# Patient Record
Sex: Male | Born: 1965
Health system: Southern US, Community
[De-identification: ages and names within clinical notes are randomized; demographics above are authoritative.]

## PROBLEM LIST (undated history)

## (undated) DIAGNOSIS — E785 Hyperlipidemia, unspecified: Secondary | ICD-10-CM

## (undated) DIAGNOSIS — M722 Plantar fascial fibromatosis: Secondary | ICD-10-CM

## (undated) DIAGNOSIS — R51 Headache: Secondary | ICD-10-CM

## (undated) DIAGNOSIS — R7302 Impaired glucose tolerance (oral): Secondary | ICD-10-CM

## (undated) DIAGNOSIS — M549 Dorsalgia, unspecified: Secondary | ICD-10-CM

## (undated) DIAGNOSIS — K219 Gastro-esophageal reflux disease without esophagitis: Secondary | ICD-10-CM

## (undated) DIAGNOSIS — I10 Essential (primary) hypertension: Secondary | ICD-10-CM

## (undated) DIAGNOSIS — A048 Other specified bacterial intestinal infections: Secondary | ICD-10-CM

## (undated) DIAGNOSIS — J309 Allergic rhinitis, unspecified: Secondary | ICD-10-CM

## (undated) HISTORY — DX: Gastro-esophageal reflux disease without esophagitis: K21.9

## (undated) HISTORY — DX: Hyperlipidemia, unspecified: E78.5

## (undated) HISTORY — DX: Other specified bacterial intestinal infections: A04.8

## (undated) HISTORY — PX: WISDOM TOOTH EXTRACTION: SHX21

## (undated) HISTORY — DX: Headache: R51

## (undated) HISTORY — DX: Essential (primary) hypertension: I10

## (undated) HISTORY — DX: Impaired glucose tolerance (oral): R73.02

## (undated) HISTORY — DX: Allergic rhinitis, unspecified: J30.9

## (undated) HISTORY — DX: Plantar fascial fibromatosis: M72.2

## (undated) HISTORY — DX: Dorsalgia, unspecified: M54.9

---
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
## 2000-08-06 ENCOUNTER — Ambulatory Visit (HOSPITAL_COMMUNITY): Admission: RE | Admit: 2000-08-06 | Discharge: 2000-08-06 | Payer: Self-pay | Admitting: Family Medicine

## 2000-08-06 ENCOUNTER — Encounter: Payer: Self-pay | Admitting: Family Medicine

## 2000-08-13 ENCOUNTER — Encounter: Payer: Self-pay | Admitting: Family Medicine

## 2000-08-13 ENCOUNTER — Ambulatory Visit (HOSPITAL_COMMUNITY): Admission: RE | Admit: 2000-08-13 | Discharge: 2000-08-13 | Payer: Self-pay | Admitting: Family Medicine

## 2004-10-05 ENCOUNTER — Emergency Department (HOSPITAL_COMMUNITY): Admission: EM | Admit: 2004-10-05 | Discharge: 2004-10-06 | Payer: Self-pay | Admitting: Emergency Medicine

## 2009-07-19 ENCOUNTER — Ambulatory Visit: Payer: Self-pay | Admitting: Internal Medicine

## 2009-07-19 DIAGNOSIS — M549 Dorsalgia, unspecified: Secondary | ICD-10-CM

## 2009-07-19 DIAGNOSIS — M722 Plantar fascial fibromatosis: Secondary | ICD-10-CM | POA: Insufficient documentation

## 2009-07-19 DIAGNOSIS — J309 Allergic rhinitis, unspecified: Secondary | ICD-10-CM | POA: Insufficient documentation

## 2009-07-19 HISTORY — DX: Allergic rhinitis, unspecified: J30.9

## 2009-07-19 HISTORY — DX: Plantar fascial fibromatosis: M72.2

## 2009-07-19 HISTORY — DX: Dorsalgia, unspecified: M54.9

## 2009-07-19 LAB — CONVERTED CEMR LAB
ALT: 19 units/L (ref 0–53)
AST: 18 units/L (ref 0–37)
Albumin: 4.1 g/dL (ref 3.5–5.2)
Alkaline Phosphatase: 80 units/L (ref 39–117)
BUN: 12 mg/dL (ref 6–23)
Basophils Absolute: 0.1 10*3/uL (ref 0.0–0.1)
Basophils Relative: 0.8 % (ref 0.0–3.0)
Bilirubin Urine: NEGATIVE
Bilirubin, Direct: 0.1 mg/dL (ref 0.0–0.3)
CO2: 35 meq/L — ABNORMAL HIGH (ref 19–32)
Calcium: 8.8 mg/dL (ref 8.4–10.5)
Chloride: 104 meq/L (ref 96–112)
Cholesterol: 207 mg/dL — ABNORMAL HIGH (ref 0–200)
Creatinine, Ser: 0.8 mg/dL (ref 0.4–1.5)
Direct LDL: 149.7 mg/dL
Eosinophils Absolute: 0.5 10*3/uL (ref 0.0–0.7)
Eosinophils Relative: 6.7 % — ABNORMAL HIGH (ref 0.0–5.0)
Glucose, Bld: 106 mg/dL — ABNORMAL HIGH (ref 70–99)
HCT: 45 % (ref 39.0–52.0)
HDL: 36.5 mg/dL — ABNORMAL LOW (ref >39.00)
Hemoglobin, Urine: NEGATIVE
Hemoglobin: 15.7 g/dL (ref 13.0–17.0)
Ketones, ur: NEGATIVE mg/dL
Leukocytes, UA: NEGATIVE
Lymphocytes Relative: 36.6 % (ref 12.0–46.0)
Lymphs Abs: 3 10*3/uL (ref 0.7–4.0)
MCHC: 34.9 g/dL (ref 30.0–36.0)
MCV: 89.8 fL (ref 78.0–100.0)
Monocytes Absolute: 0.6 10*3/uL (ref 0.1–1.0)
Monocytes Relative: 7.3 % (ref 3.0–12.0)
Neutro Abs: 3.9 10*3/uL (ref 1.4–7.7)
Neutrophils Relative %: 48.6 % (ref 43.0–77.0)
Nitrite: NEGATIVE
PSA: 0.43 ng/mL (ref 0.10–4.00)
Platelets: 284 10*3/uL (ref 150.0–400.0)
Potassium: 4.6 meq/L (ref 3.5–5.1)
RBC: 5.01 M/uL (ref 4.22–5.81)
RDW: 12.8 % (ref 11.5–14.6)
Sodium: 143 meq/L (ref 135–145)
Specific Gravity, Urine: 1.025 (ref 1.000–1.030)
TSH: 2.16 microintl units/mL (ref 0.35–5.50)
Total Bilirubin: 0.8 mg/dL (ref 0.3–1.2)
Total CHOL/HDL Ratio: 6
Total Protein, Urine: NEGATIVE mg/dL
Total Protein: 7.4 g/dL (ref 6.0–8.3)
Triglycerides: 114 mg/dL (ref 0.0–149.0)
Urine Glucose: NEGATIVE mg/dL
Urobilinogen, UA: 0.2 (ref 0.0–1.0)
VLDL: 22.8 mg/dL (ref 0.0–40.0)
WBC: 8.1 10*3/uL (ref 4.5–10.5)
pH: 6 (ref 5.0–8.0)

## 2010-08-06 ENCOUNTER — Ambulatory Visit: Payer: Self-pay | Admitting: Internal Medicine

## 2010-08-06 ENCOUNTER — Encounter: Payer: Self-pay | Admitting: Internal Medicine

## 2010-08-06 DIAGNOSIS — K219 Gastro-esophageal reflux disease without esophagitis: Secondary | ICD-10-CM

## 2010-08-06 DIAGNOSIS — R519 Headache, unspecified: Secondary | ICD-10-CM | POA: Insufficient documentation

## 2010-08-06 DIAGNOSIS — R51 Headache: Secondary | ICD-10-CM

## 2010-08-06 DIAGNOSIS — E785 Hyperlipidemia, unspecified: Secondary | ICD-10-CM

## 2010-08-06 HISTORY — DX: Gastro-esophageal reflux disease without esophagitis: K21.9

## 2010-08-06 HISTORY — DX: Headache: R51

## 2010-08-06 HISTORY — DX: Hyperlipidemia, unspecified: E78.5

## 2010-08-10 ENCOUNTER — Encounter: Payer: Self-pay | Admitting: Internal Medicine

## 2011-01-15 NOTE — Miscellaneous (Signed)
Summary: Orders Update  Clinical Lists Changes  Orders: Added new Service order of EKG w/ Interpretation (93000) - Signed 

## 2011-01-15 NOTE — Assessment & Plan Note (Signed)
Summary: physical per son--stc   Vital Signs:  Patient profile:   45 year old male Height:      63 inches Weight:      128.50 pounds BMI:     22.85 O2 Sat:      95 % on Room air Temp:     98.5 degrees F oral Pulse rate:   76 / minute BP sitting:   120 / 84  (left arm) Cuff size:   regular  Vitals Entered By: Zella Ball Ewing CMA (AAMA) (August 06, 2010 1:11 PM)  O2 Flow:  Room air  CC: Adult Physical/RE   CC:  Adult Physical/RE.  History of Present Illness: overall doing well,  Pt denies CP, worsening sob, doe, wheezing, orthopnea, pnd, worsening LE edema, palps, dizziness or syncope Pt denies new neuro symptoms such as headache, facial or extremity weakness  No fever, wt loss, night sweats, loss of appetite or other constitutional symptoms  Overall good compliance with meds, good tolerability.  Inicdently today with acute onset mild to mod ST with headache, low grade temp, and general weakness.    Preventive Screening-Counseling & Management      Drug Use:  no.    Problems Prior to Update: 1)  Gerd  (ICD-530.81) 2)  Headache  (ICD-784.0) 3)  Pharyngitis-acute  (ICD-462) 4)  Hyperlipidemia  (ICD-272.4) 5)  Plantar Fasciitis  (ICD-728.71) 6)  Back Pain  (ICD-724.5) 7)  Preventive Health Care  (ICD-V70.0) 8)  Allergic Rhinitis  (ICD-477.9)  Medications Prior to Update: 1)  Nasacort Aq 55 Mcg/act Aers (Triamcinolone Acetonide(Nasal)) .... 2 Spray/side Once Daily  Current Medications (verified): 1)  Azithromycin 250 Mg Tabs (Azithromycin) .... 2po Qd For 1 Day, Then 1po Qd For 4days, Then Stop 2)  Omeprazole 20 Mg Cpdr (Omeprazole) .Marland Kitchen.. 1po Once Daily  Allergies (verified): No Known Drug Allergies  Past History:  Past Surgical History: Last updated: 07/19/2009 Denies surgical history  Family History: Last updated: 07/19/2009 mother with HTN, stroke  Social History: Last updated: 08/06/2010 Married 2 children work -  Advertising copywriter - Clinical cytogeneticist Never Smoked Alcohol use-yes - 3 -4 beers /wk came to [place] from [place] in 1994 Drug use-no  Risk Factors: Smoking Status: never (07/19/2009)  Past Medical History: Allergic rhinitis hx of prostatitis Hyperlipidemia GERD  Social History: Reviewed history from 07/19/2009 and no changes required. Married 2 children work -  Advertising copywriter - Location manager Never Smoked Alcohol use-yes - 3 -4 beers /wk came to [place] from [place] in 1994 Drug use-no Drug Use:  no  Review of Systems  The patient denies anorexia, fever, weight loss, weight gain, vision loss, decreased hearing, hoarseness, chest pain, syncope, dyspnea on exertion, peripheral edema, prolonged cough, headaches, hemoptysis, abdominal pain, melena, hematochezia, severe indigestion/heartburn, hematuria, muscle weakness, suspicious skin lesions, transient blindness, difficulty walking, depression, unusual weight change, abnormal bleeding, enlarged lymph nodes, and angioedema.         all otherwise negative per pt -  except for mild reflux symtpoms without dysphagia, wt loss, n/v or abd pain  Physical Exam  General:  alert and well-developed.  , mild ill  Head:  normocephalic and atraumatic.   Eyes:  vision grossly intact, pupils equal, and pupils round.   Ears:  bilat tm's mild red, sinus nontender Nose:  nasal dischargemucosal pallor and mucosal edema.   Mouth:  pharyngeal erythema and fair dentition.   Neck:  supple and no masses.   Lungs:  normal respiratory effort and normal breath  sounds.   Heart:  normal rate and regular rhythm.   Abdomen:  soft, non-tender, and normal bowel sounds.   Msk:  no joint tenderness and no joint swelling.   Extremities:  no edema, no erythema  Neurologic:  cranial nerves II-XII intact and strength normal in all extremities.     Impression & Recommendations:  Problem # 1:  Preventive Health Care (ICD-V70.0) Overall doing well, age appropriate education and counseling  updated and referral for appropriate preventive services done unless declined, immunizations up to date or declined, diet counseling done if overweight, urged to quit smoking if smokes , most recent labs reviewed and current ordered if appropriate, ecg reviewed or declined (interpretation per ECG scanned in the EMR if done); information regarding Medicare Prevention requirements given if appropriate; speciality referrals updated as appropriate   Problem # 2:  HYPERLIPIDEMIA (ICD-272.4)  to follow lower chol diet, delcines statin   Labs Reviewed: SGOT: 18 (07/19/2009)   SGPT: 19 (07/19/2009)   HDL:36.50 (07/19/2009)  Chol:207 (07/19/2009)  Trig:114.0 (07/19/2009)  Problem # 3:  PHARYNGITIS-ACUTE (ICD-462)  His updated medication list for this problem includes:    Azithromycin 250 Mg Tabs (Azithromycin) .Marland Kitchen... 2po qd for 1 day, then 1po qd for 4days, then stop treat as above, f/u any worsening signs or symptoms   Problem # 4:  HEADACHE (ICD-784.0) likely assoc with above - ok for tylenol as needed   Problem # 5:  GERD (ICD-530.81)  His updated medication list for this problem includes:    Omeprazole 20 Mg Cpdr (Omeprazole) .Marland Kitchen... 1po once daily gave sample and rx for above; f/u as needed   Complete Medication List: 1)  Azithromycin 250 Mg Tabs (Azithromycin) .... 2po qd for 1 day, then 1po qd for 4days, then stop 2)  Omeprazole 20 Mg Cpdr (Omeprazole) .Marland Kitchen.. 1po once daily  Patient Instructions: 1)  Please take all new medications as prescribed  - the antibiotic, and the prilosec at 1 per day  (the prescription for the omeprazole is the same as the Prilosec - to take after the samples are done) 2)  OK to also use tylenol as you have been for the headache 3)  Please follow a lower cholesterol diet, and try to be more active if possible with regular excercise 4)  Please schedule a follow-up appointment in 1 year, or sooner if needed Prescriptions: OMEPRAZOLE 20 MG CPDR (OMEPRAZOLE) 1po  once daily  #90 x 3   Entered and Authorized by:   Corwin Levins MD   Signed by:   Corwin Levins MD on 08/06/2010   Method used:   Print then Give to Patient   RxID:   [id number] AZITHROMYCIN 250 MG TABS (AZITHROMYCIN) 2po qd for 1 day, then 1po qd for 4days, then stop  #6 x 1   Entered and Authorized by:   Corwin Levins MD   Signed by:   Corwin Levins MD on 08/06/2010   Method used:   Print then Give to Patient   RxID:   [id number]

## 2011-08-18 ENCOUNTER — Telehealth: Payer: Self-pay

## 2011-08-18 DIAGNOSIS — Z0000 Encounter for general adult medical examination without abnormal findings: Secondary | ICD-10-CM

## 2011-08-18 DIAGNOSIS — Z1289 Encounter for screening for malignant neoplasm of other sites: Secondary | ICD-10-CM

## 2011-08-18 NOTE — Telephone Encounter (Signed)
Put order in for labs. 

## 2011-09-20 ENCOUNTER — Encounter: Payer: Self-pay | Admitting: Internal Medicine

## 2011-09-20 DIAGNOSIS — Z0000 Encounter for general adult medical examination without abnormal findings: Secondary | ICD-10-CM | POA: Insufficient documentation

## 2011-09-20 DIAGNOSIS — Z0001 Encounter for general adult medical examination with abnormal findings: Secondary | ICD-10-CM | POA: Insufficient documentation

## 2011-09-25 ENCOUNTER — Other Ambulatory Visit (INDEPENDENT_AMBULATORY_CARE_PROVIDER_SITE_OTHER): Payer: BC Managed Care – PPO

## 2011-09-25 ENCOUNTER — Encounter: Payer: Self-pay | Admitting: Internal Medicine

## 2011-09-25 ENCOUNTER — Ambulatory Visit (INDEPENDENT_AMBULATORY_CARE_PROVIDER_SITE_OTHER): Payer: BC Managed Care – PPO | Admitting: Internal Medicine

## 2011-09-25 VITALS — BP 100/54 | HR 90 | Temp 97.6°F | Ht 62.0 in | Wt 131.2 lb

## 2011-09-25 DIAGNOSIS — K219 Gastro-esophageal reflux disease without esophagitis: Secondary | ICD-10-CM

## 2011-09-25 DIAGNOSIS — Z0000 Encounter for general adult medical examination without abnormal findings: Secondary | ICD-10-CM

## 2011-09-25 LAB — CBC WITH DIFFERENTIAL/PLATELET
Basophils Absolute: 0 10*3/uL (ref 0.0–0.1)
Basophils Relative: 0.2 % (ref 0.0–3.0)
Eosinophils Relative: 5 % (ref 0.0–5.0)
HCT: 44.5 % (ref 39.0–52.0)
Hemoglobin: 14.7 g/dL (ref 13.0–17.0)
Lymphocytes Relative: 32 % (ref 12.0–46.0)
Lymphs Abs: 2.9 10*3/uL (ref 0.7–4.0)
Monocytes Relative: 8 % (ref 3.0–12.0)
Neutro Abs: 5 10*3/uL (ref 1.4–7.7)
RBC: 4.77 Mil/uL (ref 4.22–5.81)
RDW: 13.3 % (ref 11.5–14.6)
WBC: 9.1 10*3/uL (ref 4.5–10.5)

## 2011-09-25 LAB — LIPID PANEL
HDL: 44.6 mg/dL (ref >39.00)
Triglycerides: 164 mg/dL — ABNORMAL HIGH (ref 0.0–149.0)
VLDL: 32.8 mg/dL (ref 0.0–40.0)

## 2011-09-25 LAB — BASIC METABOLIC PANEL
BUN: 14 mg/dL (ref 6–23)
CO2: 34 mEq/L — ABNORMAL HIGH (ref 19–32)
Calcium: 8.8 mg/dL (ref 8.4–10.5)
Creatinine, Ser: 0.8 mg/dL (ref 0.4–1.5)
GFR: 111.96 mL/min (ref >60.00)
Glucose, Bld: 128 mg/dL — ABNORMAL HIGH (ref 70–99)

## 2011-09-25 LAB — URINALYSIS, ROUTINE W REFLEX MICROSCOPIC
Ketones, ur: NEGATIVE
Leukocytes, UA: NEGATIVE
Nitrite: NEGATIVE
Specific Gravity, Urine: 1.02 (ref 1.000–1.030)
Urobilinogen, UA: 0.2 (ref 0.0–1.0)
pH: 5.5 (ref 5.0–8.0)

## 2011-09-25 LAB — HEPATIC FUNCTION PANEL
Albumin: 4.1 g/dL (ref 3.5–5.2)
Total Protein: 7.1 g/dL (ref 6.0–8.3)

## 2011-09-25 LAB — PSA: PSA: 0.54 ng/mL (ref 0.10–4.00)

## 2011-09-25 MED ORDER — PANTOPRAZOLE SODIUM 40 MG PO TBEC: 40.0000 mg | DELAYED_RELEASE_TABLET | Freq: Every day | ORAL | Status: DC

## 2011-09-25 NOTE — Patient Instructions (Addendum)
Please stop the omeprazole 20 mg Start the generic Protonix 40 mg per day (for the reflux) Please go to LAB in the Basement for the blood and/or urine tests to be done today Please call the phone number [phone] (the [employer] System) for results of testing in 2-3 days;  When calling, simply dial the number, and when prompted enter the MRN number above (the Medical Record Number) and the # key, then the message should start. Please call for referral to ENT if the chronic tonsillitis is worse Please return in 1 year for your yearly visit, or sooner if needed, with Lab testing done 3-5 days before

## 2011-09-25 NOTE — Assessment & Plan Note (Signed)

## 2011-09-26 ENCOUNTER — Encounter: Payer: Self-pay | Admitting: Internal Medicine

## 2011-09-26 DIAGNOSIS — R7302 Impaired glucose tolerance (oral): Secondary | ICD-10-CM

## 2011-09-26 HISTORY — DX: Impaired glucose tolerance (oral): R73.02

## 2011-09-26 NOTE — Progress Notes (Signed)
Subjective:    Patient ID: Roger Long, male    DOB: 27-Jul-1966, 45 y.o.   MRN: [id number]  HPI  Here for wellness and f/u;  Overall doing ok;  Pt denies CP, worsening SOB, DOE, wheezing, orthopnea, PND, worsening LE edema, palpitations, dizziness or syncope.  Pt denies neurological change such as new Headache, facial or extremity weakness.  Pt denies polydipsia, polyuria, or low sugar symptoms. Pt states overall good compliance with treatment and medications, good tolerability, and trying to follow lower cholesterol diet.  Pt denies worsening depressive symptoms, suicidal ideation or panic. No fever, wt loss, night sweats, loss of appetite, or other constitutional symptoms.  Pt states good ability with ADL's, low fall risk, home safety reviewed and adequate, no significant changes in hearing or vision, and occasionally active with exercise.  States does have symptoms of chronic tonsillitis with right side post pharynx tonsil with recurring material coming out that ends up on the tongue intermittent mild for almost a yr.  Also has a minor recurring tenderness to the right lateral epicondylar area without swelling, erythema, trauma or decreased ROM elbow.  Does also have mild intermittent reflux, but no dysphagia, abd pain, n/v, bowel change or blood. Past Medical History  Diagnosis Date  . ALLERGIC RHINITIS 07/19/2009  . BACK PAIN 07/19/2009  . GERD 08/06/2010  . Headache 08/06/2010  . HYPERLIPIDEMIA 08/06/2010  . PLANTAR FASCIITIS 07/19/2009  . Impaired glucose tolerance 09/26/2011   No past surgical history on file.  reports that he has never smoked. He does not have any smokeless tobacco history on file. He reports that he drinks alcohol. He reports that he does not use illicit drugs. family history includes Hypertension in his mother and Stroke in his mother. No Known Allergies No current outpatient prescriptions on file prior to visit.   Review of Systems Review of Systems  Constitutional: Negative  for diaphoresis, activity change, appetite change and unexpected weight change.  HENT: Negative for hearing loss, ear pain, facial swelling, mouth sores and neck stiffness.   Eyes: Negative for pain, redness and visual disturbance.  Respiratory: Negative for shortness of breath and wheezing.   Cardiovascular: Negative for chest pain and palpitations.  Gastrointestinal: Negative for diarrhea, blood in stool, abdominal distention and rectal pain.  Genitourinary: Negative for hematuria, flank pain and decreased urine volume.  Musculoskeletal: Negative for myalgias and joint swelling.  Skin: Negative for color change and wound.  Neurological: Negative for syncope and numbness.  Hematological: Negative for adenopathy.  Psychiatric/Behavioral: Negative for hallucinations, self-injury, decreased concentration and agitation.        Objective:   Physical Exam BP 100/54  Pulse 90  Temp(Src) 97.6 F (36.4 C) (Oral)  Ht 5\' 2"  (1.575 m)  Wt 131 lb 4 oz (59.535 kg)  BMI 24.01 kg/m2  SpO2 96% Physical Exam  VS noted Constitutional: Pt is oriented to person, place, and time. Appears well-developed and well-nourished.  HENT:  Head: Normocephalic and atraumatic.  Right Ear: External ear normal.  Left Ear: External ear normal.  Nose: Nose normal.  Mouth/Throat: Oropharynx is clear and moist. without overt tonsillar enlargment or mass visible Eyes: Conjunctivae and EOM are normal. Pupils are equal, round, and reactive to light.  Neck: Normal range of motion. Neck supple. No JVD present. No tracheal deviation present.  Cardiovascular: Normal rate, regular rhythm, normal heart sounds and intact distal pulses.   Pulmonary/Chest: Effort normal and breath sounds normal.  Abdominal: Soft. Bowel sounds are normal. There is no tenderness.  Musculoskeletal: Normal range of motion. Exhibits no edema.  Lymphadenopathy:  Has no cervical adenopathy.  Neurological: Pt is alert and oriented to person, place, and  time. Pt has normal reflexes. No cranial nerve deficit.  Skin: Skin is warm and dry. No rash noted.  Psychiatric:  Has  normal mood and affect. Behavior is normal.  Right elbow nontender, swollen or decreased ROM    Assessment & Plan:

## 2011-09-26 NOTE — Assessment & Plan Note (Signed)
Ok to change the PPI to protonix daily,  to f/u any worsening symptoms or concerns

## 2011-11-06 ENCOUNTER — Other Ambulatory Visit: Payer: Self-pay | Admitting: Emergency Medicine

## 2011-11-06 DIAGNOSIS — M542 Cervicalgia: Secondary | ICD-10-CM

## 2011-11-09 ENCOUNTER — Ambulatory Visit
Admission: RE | Admit: 2011-11-09 | Discharge: 2011-11-09 | Disposition: A | Discharge status: Home / Self Care | Payer: BC Managed Care – PPO | Source: Ambulatory Visit | Attending: Emergency Medicine | Admitting: Emergency Medicine

## 2011-11-09 DIAGNOSIS — M542 Cervicalgia: Secondary | ICD-10-CM

## 2011-11-09 MED ORDER — IOHEXOL 300 MG/ML  SOLN
75.0000 mL | Freq: Once | INTRAMUSCULAR | Status: AC | PRN
  Administered 2011-11-09: 75 mL via INTRAVENOUS

## 2012-02-19 ENCOUNTER — Ambulatory Visit (INDEPENDENT_AMBULATORY_CARE_PROVIDER_SITE_OTHER): Payer: BC Managed Care – PPO | Admitting: Family Medicine

## 2012-02-19 VITALS — BP 134/72 | HR 71 | Temp 98.2°F | Resp 16 | Ht 62.5 in | Wt 130.6 lb

## 2012-02-19 DIAGNOSIS — E785 Hyperlipidemia, unspecified: Secondary | ICD-10-CM

## 2012-02-19 DIAGNOSIS — I10 Essential (primary) hypertension: Secondary | ICD-10-CM

## 2012-02-19 MED ORDER — LISINOPRIL 20 MG PO TABS: 10.0000 mg | ORAL_TABLET | Freq: Every day | ORAL | Status: DC

## 2012-02-19 MED ORDER — PRAVASTATIN SODIUM 10 MG PO TABS: 10.0000 mg | ORAL_TABLET | Freq: Every day | ORAL | Status: DC

## 2012-02-19 NOTE — Progress Notes (Signed)
[facility]:  Office Visit  Chief Complaint:  Chief Complaint  Patient presents with  . Rx refill    pt is not fasting    HPI: Roger Long is a 46 y.o. male who complains of : 1. HTN med refill-not taking meds x 1 month, no sxs, no SEs when taking meds, does not measure BP regular  at home, when he does measure it is in the 130s/80s. Every time he has come to our office he has been in some type of pain/illness so BP was elevated.  2. Hyperlipidemia-did not know he had cholesterol problems, eats a lot of beef.   Past Medical History  Diagnosis Date  . ALLERGIC RHINITIS 07/19/2009  . BACK PAIN 07/19/2009  . GERD 08/06/2010  . Headache 08/06/2010  . HYPERLIPIDEMIA 08/06/2010  . PLANTAR FASCIITIS 07/19/2009  . Impaired glucose tolerance 09/26/2011   No past surgical history on file. History   Social History  . Marital Status: Married    Spouse Name: N/A    Number of Children: N/A  . Years of Education: N/A   Occupational History  . Wellsite geologist    Social History Main Topics  . Smoking status: Never Smoker   . Smokeless tobacco: None  . Alcohol Use: Yes     3-4 beers  week  . Drug Use: No  . Sexually Active: None   Other Topics Concern  . None   Social History Narrative   Came to [place] from [place] in 1994   Family History  Problem Relation Age of Onset  . Hypertension Mother   . Stroke Mother    No Known Allergies Prior to Admission medications   Medication Sig Start Date End Date Taking? Authorizing Provider  lisinopril (PRINIVIL,ZESTRIL) 20 MG tablet Take 20 mg by mouth daily.   Yes Historical Provider, MD  pantoprazole (PROTONIX) 40 MG tablet Take 1 tablet (40 mg total) by mouth daily. 09/25/11 09/24/12  Corwin Levins, MD     ROS: The patient denies fevers, chills, night sweats, unintentional weight loss, chest pain, palpitations, wheezing, dyspnea on exertion, nausea, vomiting, abdominal pain, dysuria, hematuria, melena,  numbness, weakness, or tingling.  All other systems have been reviewed and were otherwise negative with the exception of those mentioned in the HPI and as above.    PHYSICAL EXAM: Filed Vitals:   02/19/12 1342  BP: 134/72  Pulse: 71  Temp: 98.2 F (36.8 C)  Resp: 16   Filed Vitals:   02/19/12 1342  Height: 5' 2.5" (1.588 m)  Weight: 130 lb 9.6 oz (59.24 kg)   Body mass index is 23.51 kg/(m^2).  General: Alert, no acute distress HEENT:  Normocephalic, atraumatic, oropharynx patent.  Cardiovascular:  Regular rate and rhythm, no rubs murmurs or gallops.  No Carotid bruits, radial pulse intact. No pedal edema.  Respiratory: Clear to auscultation bilaterally.  No wheezes, rales, or rhonchi.  No cyanosis, no use of accessory musculature GI: No organomegaly, abdomen is soft and non-tender, positive bowel sounds.  No masses. Skin: No rashes. Neurologic: Facial musculature symmetric. Psychiatric: Patient is appropriate throughout our interaction. Lymphatic: No cervical lymphadenopathy Musculoskeletal: Gait intact.   LABS: Results for orders placed in visit on 09/25/11  LIPID PANEL      Component Value Range   Cholesterol 200  0 - 200 (mg/dL)   Triglycerides 454.0 (*) 0.0 - 149.0 (mg/dL)   HDL 98.11  >91.47 (mg/dL)   VLDL 82.9  0.0 - 56.2 (  mg/dL)   LDL Cholesterol 578 (*) 0 - 99 (mg/dL)   Total CHOL/HDL Ratio 4    BASIC METABOLIC PANEL      Component Value Range   Sodium 141  135 - 145 (mEq/L)   Potassium 4.4  3.5 - 5.1 (mEq/L)   Chloride 101  96 - 112 (mEq/L)   CO2 34 (*) 19 - 32 (mEq/L)   Glucose, Bld 128 (*) 70 - 99 (mg/dL)   BUN 14  6 - 23 (mg/dL)   Creatinine, Ser 0.8  0.4 - 1.5 (mg/dL)   Calcium 8.8  8.4 - 46.9 (mg/dL)   GFR 629.52  >84.13 (mL/min)  HEPATIC FUNCTION PANEL      Component Value Range   Total Bilirubin 0.4  0.3 - 1.2 (mg/dL)   Bilirubin, Direct 0.0  0.0 - 0.3 (mg/dL)   Alkaline Phosphatase 58  39 - 117 (U/L)   AST 17  0 - 37 (U/L)   ALT 20  0 - 53  (U/L)   Total Protein 7.1  6.0 - 8.3 (g/dL)   Albumin 4.1  3.5 - 5.2 (g/dL)  CBC WITH DIFFERENTIAL      Component Value Range   WBC 9.1  4.5 - 10.5 (K/uL)   RBC 4.77  4.22 - 5.81 (Mil/uL)   Hemoglobin 14.7  13.0 - 17.0 (g/dL)   HCT 24.4  01.0 - 27.2 (%)   MCV 93.3  78.0 - 100.0 (fl)   MCHC 33.0  30.0 - 36.0 (g/dL)   RDW 53.6  64.4 - 03.4 (%)   Platelets 277.0  150.0 - 400.0 (K/uL)   Neutrophils Relative 54.8  43.0 - 77.0 (%)   Lymphocytes Relative 32.0  12.0 - 46.0 (%)   Monocytes Relative 8.0  3.0 - 12.0 (%)   Eosinophils Relative 5.0  0.0 - 5.0 (%)   Basophils Relative 0.2  0.0 - 3.0 (%)   Neutro Abs 5.0  1.4 - 7.7 (K/uL)   Lymphs Abs 2.9  0.7 - 4.0 (K/uL)   Monocytes Absolute 0.7  0.1 - 1.0 (K/uL)   Eosinophils Absolute 0.5  0.0 - 0.7 (K/uL)   Basophils Absolute 0.0  0.0 - 0.1 (K/uL)  TSH      Component Value Range   TSH 1.30  0.35 - 5.50 (uIU/mL)  URINALYSIS, ROUTINE W REFLEX MICROSCOPIC      Component Value Range   Color, Urine LT. YELLOW  Yellow;Lt. Yellow    APPearance CLEAR  Clear    Specific Gravity, Urine 1.020  1.000 - 1.030    pH 5.5  5.0 - 8.0    Total Protein, Urine NEGATIVE  Negative    Urine Glucose NEGATIVE  Negative    Ketones, ur NEGATIVE  Negative    Bilirubin Urine NEGATIVE  Negative    Hgb urine dipstick NEGATIVE  Negative    Urobilinogen, UA 0.2  0.0 - 1.0    Leukocytes, UA NEGATIVE  Negative    Nitrite NEGATIVE  Negative    WBC, UA 0-2/hpf  0-2/hpf   PSA      Component Value Range   PSA 0.54  0.10 - 4.00 (ng/mL)     EKG/XRAY:   Primary read interpreted by Dr. Conley Rolls at [facility].   ASSESSMENT/PLAN: Encounter Diagnoses  Name Primary?  . HTN (hypertension) Yes  . Hyperlipidemia    1. Need to monitor-decreased his BP med from 20 to 10 since he states his BP is less than 135/80 at home not on meds. He  has been out of meds x 1 month. Told him to moniotr BP 2. XOL-start him on statin since I do not think diet and lifestyle modifications are going to  work. There is a language barrier and patient eats a lot of beef and has gained weight. Risks and benefits d/w patient. He knows to monitor for msk cramps and also to return in 6 months for recheck. IF lipids are normal then I will tell him to stop medication.  3. Pending labs CMP, Lipid     Roger Fross PHUONG, DO 02/19/2012 2:26 PM

## 2012-02-20 LAB — COMPREHENSIVE METABOLIC PANEL WITH GFR
AST: 21 U/L (ref 0–37)
Albumin: 4.8 g/dL (ref 3.5–5.2)
BUN: 11 mg/dL (ref 6–23)
Calcium: 9 mg/dL (ref 8.4–10.5)
Chloride: 100 meq/L (ref 96–112)
Glucose, Bld: 88 mg/dL (ref 70–99)
Potassium: 4.4 meq/L (ref 3.5–5.3)

## 2012-02-20 LAB — COMPREHENSIVE METABOLIC PANEL
ALT: 20 U/L (ref 0–53)
Alkaline Phosphatase: 61 U/L (ref 39–117)
CO2: 29 mEq/L (ref 19–32)
Creat: 0.78 mg/dL (ref 0.50–1.35)
Sodium: 140 mEq/L (ref 135–145)
Total Bilirubin: 0.3 mg/dL (ref 0.3–1.2)
Total Protein: 7.3 g/dL (ref 6.0–8.3)

## 2012-02-20 LAB — LIPID PANEL
Cholesterol: 197 mg/dL (ref 0–200)
HDL: 42 mg/dL (ref >39)
LDL Cholesterol: 126 mg/dL — ABNORMAL HIGH (ref 0–99)
Total CHOL/HDL Ratio: 4.7 Ratio
Triglycerides: 143 mg/dL (ref <150)
VLDL: 29 mg/dL (ref 0–40)

## 2012-02-25 ENCOUNTER — Encounter: Payer: Self-pay | Admitting: Family Medicine

## 2012-04-11 ENCOUNTER — Other Ambulatory Visit: Payer: Self-pay | Admitting: Family Medicine

## 2012-08-09 ENCOUNTER — Ambulatory Visit (INDEPENDENT_AMBULATORY_CARE_PROVIDER_SITE_OTHER): Payer: BC Managed Care – PPO | Admitting: Family Medicine

## 2012-08-09 ENCOUNTER — Ambulatory Visit: Payer: BC Managed Care – PPO

## 2012-08-09 VITALS — BP 116/76 | HR 76 | Temp 98.3°F | Resp 17 | Ht 62.5 in | Wt 130.0 lb

## 2012-08-09 DIAGNOSIS — M542 Cervicalgia: Secondary | ICD-10-CM

## 2012-08-09 DIAGNOSIS — M25529 Pain in unspecified elbow: Secondary | ICD-10-CM

## 2012-08-09 DIAGNOSIS — M25539 Pain in unspecified wrist: Secondary | ICD-10-CM

## 2012-08-09 DIAGNOSIS — M25519 Pain in unspecified shoulder: Secondary | ICD-10-CM

## 2012-08-09 DIAGNOSIS — T148XXA Other injury of unspecified body region, initial encounter: Secondary | ICD-10-CM

## 2012-08-09 MED ORDER — CYCLOBENZAPRINE HCL 5 MG PO TABS: 5.0000 mg | ORAL_TABLET | Freq: Every evening | ORAL | Status: AC | PRN

## 2012-08-09 MED ORDER — TRAMADOL HCL 50 MG PO TABS: 50.0000 mg | ORAL_TABLET | Freq: Three times a day (TID) | ORAL | Status: AC | PRN

## 2012-08-09 MED ORDER — MELOXICAM 7.5 MG PO TABS: 7.5000 mg | ORAL_TABLET | Freq: Every day | ORAL | Status: AC
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
## 2012-08-12 ENCOUNTER — Ambulatory Visit (INDEPENDENT_AMBULATORY_CARE_PROVIDER_SITE_OTHER): Payer: BC Managed Care – PPO | Admitting: Family Medicine

## 2012-08-12 ENCOUNTER — Encounter: Payer: Self-pay | Admitting: Family Medicine

## 2012-08-12 VITALS — BP 122/80 | HR 84 | Temp 98.1°F | Resp 16 | Ht 62.5 in | Wt 129.0 lb

## 2012-08-12 DIAGNOSIS — E785 Hyperlipidemia, unspecified: Secondary | ICD-10-CM

## 2012-08-12 DIAGNOSIS — Z609 Problem related to social environment, unspecified: Secondary | ICD-10-CM

## 2012-08-12 DIAGNOSIS — I10 Essential (primary) hypertension: Secondary | ICD-10-CM

## 2012-08-12 DIAGNOSIS — Z789 Other specified health status: Secondary | ICD-10-CM

## 2012-08-12 MED ORDER — LISINOPRIL 20 MG PO TABS: 10.0000 mg | ORAL_TABLET | Freq: Every day | ORAL | Status: DC

## 2012-08-12 NOTE — Progress Notes (Signed)
  Subjective:    Patient ID: Roger Long, male    DOB: [birth year], 46 y.o.   MRN: [id number]  HPI Roger Long is a 46 y.o. male  HTN - last discussed 02/19/12.  Off meds x 1 month at that time - BP 134/72.  Creat 0.78.  Has been taking lisinopril 20mg  - 1/2 pill each day (10mg ).  Outside BP - in 120's systolics.   Bump in neck - noted in past - told no problem - no recent changes. Feels move when opening mouth. Picks at with toothpick at times in back of (from inside mouth) Yellow jelly expressed.    Hyperlipidemia - tchol 200 to 197, HDL 44 to 42, and LDL 123 to 126 from 10/12 blood draw to 3/13 blood draw. Was told to stop cholesterol medicine.    Last po at 1pm - not fasting.    Here with interpreter.    Review of Systems  Constitutional: Negative for fatigue and unexpected weight change.  Eyes: Negative for visual disturbance.  Respiratory: Negative for cough, chest tightness and shortness of breath.   Cardiovascular: Negative for chest pain, palpitations and leg swelling.  Gastrointestinal: Negative for abdominal pain and blood in stool.  Neurological: Negative for dizziness, light-headedness and headaches.       Objective:   Physical Exam  Constitutional: He is oriented to person, place, and time. He appears well-developed and well-nourished.  HENT:  Head: Normocephalic and atraumatic.  Eyes: EOM are normal. Pupils are equal, round, and reactive to light.  Neck: No JVD present. Carotid bruit is not present.  Cardiovascular: Normal rate, regular rhythm and normal heart sounds.   No murmur heard. Pulmonary/Chest: Effort normal and breath sounds normal. He has no rales.  Abdominal: There is no tenderness.  Musculoskeletal: He exhibits no edema.  Neurological: He is alert and oriented to person, place, and time.  Skin: Skin is warm and dry.  Psychiatric: He has a normal mood and affect. His behavior is normal.        Assessment & Plan:  Roger Long is a 46 y.o. male 1. HTN  (hypertension)  lisinopril (PRINIVIL,ZESTRIL) 20 MG tablet, Comprehensive metabolic panel  2. Hyperlipidemia  Lipid panel  3. Language Barrier      HTN - controlled.  Continue 10mg  lisinopril each day (1/2 of 20mg  qd). Refilled meds.   Hyperlipidemia - borderline in past.  Not fasting currently - will have lab visit only tomorrow am, and decide if meds needed. Prior on pravastatin.  Language barrier - interpreter present during visit. Understanding expressed.

## 2012-08-12 NOTE — Patient Instructions (Signed)
Lab visit in the morning.  recheck in 6 months - can schedule physical then.  Keep a record of your blood pressures outside of the office and bring them to the next office visit. Return to the clinic or go to the nearest emergency room if any of your symptoms worsen or new symptoms occur. Marland Kitchen

## 2012-08-13 ENCOUNTER — Ambulatory Visit (INDEPENDENT_AMBULATORY_CARE_PROVIDER_SITE_OTHER): Payer: BC Managed Care – PPO | Admitting: Family Medicine

## 2012-08-13 VITALS — BP 120/83 | HR 90 | Temp 97.2°F | Resp 18 | Wt 131.0 lb

## 2012-08-13 DIAGNOSIS — E785 Hyperlipidemia, unspecified: Secondary | ICD-10-CM

## 2012-08-13 DIAGNOSIS — I10 Essential (primary) hypertension: Secondary | ICD-10-CM

## 2012-08-13 LAB — COMPREHENSIVE METABOLIC PANEL
ALT: 21 U/L (ref 0–53)
AST: 15 U/L (ref 0–37)
Alkaline Phosphatase: 52 U/L (ref 39–117)
CO2: 32 mEq/L (ref 19–32)
Sodium: 140 mEq/L (ref 135–145)
Total Bilirubin: 0.3 mg/dL (ref 0.3–1.2)
Total Protein: 7.5 g/dL (ref 6.0–8.3)

## 2012-08-13 LAB — LIPID PANEL
LDL Cholesterol: 118 mg/dL — ABNORMAL HIGH (ref 0–99)
Total CHOL/HDL Ratio: 4.7 Ratio
VLDL: 42 mg/dL — ABNORMAL HIGH (ref 0–40)

## 2012-08-13 NOTE — Progress Notes (Signed)
Lab visit only. 

## 2012-08-15 ENCOUNTER — Other Ambulatory Visit: Payer: Self-pay | Admitting: Physician Assistant

## 2012-08-15 DIAGNOSIS — E785 Hyperlipidemia, unspecified: Secondary | ICD-10-CM

## 2012-08-15 DIAGNOSIS — I10 Essential (primary) hypertension: Secondary | ICD-10-CM

## 2012-08-15 MED ORDER — PRAVASTATIN SODIUM 10 MG PO TABS: 10.0000 mg | ORAL_TABLET | Freq: Every day | ORAL | Status: DC

## 2012-08-15 NOTE — Progress Notes (Signed)
See detailed note under labs from today.

## 2012-09-07 ENCOUNTER — Ambulatory Visit (INDEPENDENT_AMBULATORY_CARE_PROVIDER_SITE_OTHER): Payer: BC Managed Care – PPO | Admitting: Family Medicine

## 2012-09-07 VITALS — BP 112/80 | HR 81 | Temp 98.1°F | Resp 16 | Ht 63.0 in | Wt 130.6 lb

## 2012-09-07 DIAGNOSIS — T148XXA Other injury of unspecified body region, initial encounter: Secondary | ICD-10-CM

## 2012-09-07 DIAGNOSIS — S46219A Strain of muscle, fascia and tendon of other parts of biceps, unspecified arm, initial encounter: Secondary | ICD-10-CM

## 2012-09-07 DIAGNOSIS — S46819A Strain of other muscles, fascia and tendons at shoulder and upper arm level, unspecified arm, initial encounter: Secondary | ICD-10-CM

## 2012-09-07 DIAGNOSIS — S43499A Other sprain of unspecified shoulder joint, initial encounter: Secondary | ICD-10-CM
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
## 2012-09-08 ENCOUNTER — Encounter: Payer: Self-pay | Admitting: Family Medicine

## 2012-09-30 ENCOUNTER — Encounter: Payer: BC Managed Care – PPO | Admitting: Internal Medicine

## 2012-11-06 ENCOUNTER — Other Ambulatory Visit: Payer: Self-pay | Admitting: Internal Medicine

## 2013-01-09 ENCOUNTER — Encounter: Payer: Self-pay | Admitting: Family Medicine

## 2013-01-09 ENCOUNTER — Ambulatory Visit (INDEPENDENT_AMBULATORY_CARE_PROVIDER_SITE_OTHER): Payer: BC Managed Care – PPO | Admitting: Family Medicine

## 2013-01-09 VITALS — BP 140/88 | HR 81 | Temp 98.8°F | Resp 16 | Ht 62.5 in | Wt 132.8 lb

## 2013-01-09 DIAGNOSIS — E785 Hyperlipidemia, unspecified: Secondary | ICD-10-CM

## 2013-01-09 DIAGNOSIS — K219 Gastro-esophageal reflux disease without esophagitis: Secondary | ICD-10-CM

## 2013-01-09 DIAGNOSIS — I10 Essential (primary) hypertension: Secondary | ICD-10-CM

## 2013-01-09 DIAGNOSIS — K649 Unspecified hemorrhoids: Secondary | ICD-10-CM

## 2013-01-09 DIAGNOSIS — Z0000 Encounter for general adult medical examination without abnormal findings: Secondary | ICD-10-CM

## 2013-01-09 LAB — COMPREHENSIVE METABOLIC PANEL
ALT: 25 U/L (ref 0–53)
AST: 21 U/L (ref 0–37)
Albumin: 4.7 g/dL (ref 3.5–5.2)
Alkaline Phosphatase: 56 U/L (ref 39–117)
Calcium: 9 mg/dL (ref 8.4–10.5)
Chloride: 102 mEq/L (ref 96–112)
Potassium: 3.9 mEq/L (ref 3.5–5.3)
Sodium: 140 mEq/L (ref 135–145)

## 2013-01-09 LAB — CBC
HCT: 43 % (ref 39.0–52.0)
MCV: 88.8 fL (ref 78.0–100.0)
Platelets: 347 10*3/uL (ref 150–400)
RBC: 4.84 MIL/uL (ref 4.22–5.81)
RDW: 13.5 % (ref 11.5–15.5)
WBC: 7.3 10*3/uL (ref 4.0–10.5)

## 2013-01-09 LAB — LIPID PANEL: LDL Cholesterol: 132 mg/dL — ABNORMAL HIGH (ref 0–99)

## 2013-01-09 MED ORDER — HYDROCORTISONE 2.5 % RE CREA: TOPICAL_CREAM | Freq: Two times a day (BID) | RECTAL | Status: DC | PRN

## 2013-01-09 MED ORDER — LISINOPRIL 20 MG PO TABS: 10.0000 mg | ORAL_TABLET | Freq: Every day | ORAL | Status: DC

## 2013-01-09 MED ORDER — PRAVASTATIN SODIUM 10 MG PO TABS: 10.0000 mg | ORAL_TABLET | Freq: Every day | ORAL | Status: DC

## 2013-01-09 MED ORDER — PANTOPRAZOLE SODIUM 40 MG PO TBEC: 40.0000 mg | DELAYED_RELEASE_TABLET | Freq: Every day | ORAL | Status: DC

## 2013-01-09 NOTE — Patient Instructions (Signed)
If hemorrhoids return - can use new cream, but look at instructions below.  If this problem continues - recheck.  We will refer you to a gastroenterologist for the continued heartburn symptoms.  Your should receive a call or letter about your lab results within the next week to 10 days.  Return to the clinic or go to the nearest emergency room if any of your symptoms worsen or new symptoms occur.  Hemorrhoids Hemorrhoids are enlarged (dilated) veins around the rectum. There are 2 types of hemorrhoids, and the type of hemorrhoid is determined by its location. Internal hemorrhoids occur in the veins just inside the rectum.They are usually not painful, but they may bleed.However, they may poke through to the outside and become irritated and painful. External hemorrhoids involve the veins outside the anus and can be felt as a painful swelling or hard lump near the anus.They are often itchy and may crack and bleed. Sometimes clots will form in the veins. This makes them swollen and painful. These are called thrombosed hemorrhoids. CAUSES Causes of hemorrhoids include:  Pregnancy. This increases the pressure in the hemorrhoidal veins.  Constipation.  Straining to have a bowel movement.  Obesity.  Heavy lifting or other activity that caused you to strain. TREATMENT Most of the time hemorrhoids improve in 1 to 2 weeks. However, if symptoms do not seem to be getting better or if you have a lot of rectal bleeding, your caregiver may perform a procedure to help make the hemorrhoids get smaller or remove them completely.Possible treatments include:  Rubber band ligation. A rubber band is placed at the base of the hemorrhoid to cut off the circulation.  Sclerotherapy. A chemical is injected to shrink the hemorrhoid.  Infrared light therapy. Tools are used to burn the hemorrhoid.  Hemorrhoidectomy. This is surgical removal of the hemorrhoid. HOME CARE INSTRUCTIONS   Increase fiber in your diet.  Ask your caregiver about using fiber supplements.  Drink enough water and fluids to keep your urine clear or pale yellow.  Exercise regularly.  Go to the bathroom when you have the urge to have a bowel movement. Do not wait.  Avoid straining to have bowel movements.  Keep the anal area dry and clean.  Only take over-the-counter or prescription medicines for pain, discomfort, or fever as directed by your caregiver. If your hemorrhoids are thrombosed:  Take warm sitz baths for 20 to 30 minutes, 3 to 4 times per day.  If the hemorrhoids are very tender and swollen, place ice packs on the area as tolerated. Using ice packs between sitz baths may be helpful. Fill a plastic bag with ice. Place a towel between the bag of ice and your skin.  Medicated creams and suppositories may be used or applied as directed.  Do not use a donut-shaped pillow or sit on the toilet for long periods. This increases blood pooling and pain. SEEK MEDICAL CARE IF:   You have increasing pain and swelling that is not controlled with your medicine.  You have uncontrolled bleeding.  You have difficulty or you are unable to have a bowel movement.  You have pain or inflammation outside the area of the hemorrhoids.  You have chills or an oral temperature above 102 F (38.9 C). MAKE SURE YOU:   Understand these instructions.  Will watch your condition.  Will get help right away if you are not doing well or get worse. Document Released: 11/27/2000 Document Revised: 02/22/2012 Document Reviewed: 11/10/2010 Exit[employer] Patient Information 2013  [employer]  Keeping you healthy  Get these tests  Blood pressure- Have your blood pressure checked once a year by your healthcare provider.  Normal blood pressure is 120/80.  Weight- Have your body mass index (BMI) calculated to screen for obesity.  BMI is a measure of body fat based on height and weight. You can also calculate your own BMI at  https://www.west-esparza.com/.  Cholesterol- Have your cholesterol checked regularly starting at age 30, sooner may be necessary if you have diabetes, high blood pressure, if a family member developed heart diseases at an early age or if you smoke.   Chlamydia, HIV, and other sexual transmitted disease- Get screened each year until the age of 85 then within three months of each new sexual partner.  Diabetes- Have your blood sugar checked regularly if you have high blood pressure, high cholesterol, a family history of diabetes or if you are overweight.  Get these vaccines  Flu shot- Every fall.  Tetanus shot- Every 10 years.  Menactra- Single dose; prevents meningitis.  Take these steps  Don't smoke- If you do smoke, ask your healthcare provider about quitting. For tips on how to quit, go to www.smokefree.gov or call 1-800-QUIT-NOW.  Be physically active- Exercise 5 days a week for at least 30 minutes.  If you are not already physically active start slow and gradually work up to 30 minutes of moderate physical activity.  Examples of moderate activity include walking briskly, mowing the yard, dancing, swimming bicycling, etc.  Eat a healthy diet- Eat a variety of healthy foods such as fruits, vegetables, low fat milk, low fat cheese, yogurt, lean meats, poultry, fish, beans, tofu, etc.  For more information on healthy eating, go to www.thenutritionsource.org  Drink alcohol in moderation- Limit alcohol intake two drinks or less a day.  Never drink and drive.  Dentist- Brush and floss teeth twice daily; visit your dentis twice a year.  Depression-Your emotional health is as important as your physical health.  If you're feeling down, losing interest in things you normally enjoy please talk with your healthcare provider.  Gun Safety- If you keep a gun in your home, keep it unloaded and with the safety lock on.  Bullets should be stored separately.  Helmet use- Always wear a helmet when riding a  motorcycle, bicycle, rollerblading or skateboarding.  Safe sex- If you may be exposed to a sexually transmitted infection, use a condom  Seat belts- Seat bels can save your life; always wear one.  Smoke/Carbon Monoxide detectors- These detectors need to be installed on the appropriate level of your home.  Replace batteries at least once a year.  Skin Cancer- When out in the sun, cover up and use sunscreen SPF 15 or higher.  Violence- If anyone is threatening or hurting you, please tell your healthcare provider.

## 2013-01-09 NOTE — Progress Notes (Signed)
Subjective:    Patient ID: Roger Long, male    DOB: 1966-11-12, 47 y.o.   MRN: [id number]  HPI Roger Long is a 47 y.o. male Here for CPE Flu vaccine 09/13/12 Tetanus in 2010.  henorrhoids last 2 months at times hurts to have BM - using otc med - helps a little.  No bleeding - just soreness. Ok right now - not flared  HTN - takes 1/2 of lisinopril 20mg  (10mg  dose) each day. No missed doses. No new side effects of meds. Home BP's: 111/70 ,11/79, 123/85, 125/79, 134/100, 120/87.  No chest pain, no lightheadedness or dizziness. No new side effect with meds.   Hyperlipidemia - restarted pravastatin after last ov in August 2013. Taking pravastaitn each day.  Results for orders placed in visit on 08/13/12  COMPREHENSIVE METABOLIC PANEL      Component Value Range   Sodium 140  135 - 145 mEq/L   Potassium 4.9  3.5 - 5.3 mEq/L   Chloride 102  96 - 112 mEq/L   CO2 32  19 - 32 mEq/L   Glucose, Bld 99  70 - 99 mg/dL   BUN 15  6 - 23 mg/dL   Creat 2.95  2.84 - 1.32 mg/dL   Total Bilirubin 0.3  0.3 - 1.2 mg/dL   Alkaline Phosphatase 52  39 - 117 U/L   AST 15  0 - 37 U/L   ALT 21  0 - 53 U/L   Total Protein 7.5  6.0 - 8.3 g/dL   Albumin 4.9  3.5 - 5.2 g/dL   Calcium 9.1  8.4 - 44.0 mg/dL  LIPID PANEL      Component Value Range   Cholesterol 203 (*) 0 - 200 mg/dL   Triglycerides 102 (*) <150 mg/dL   HDL 43  >72 mg/dL   Total CHOL/HDL Ratio 4.7     VLDL 42 (*) 0 - 40 mg/dL   LDL Cholesterol 536 (*) 0 - 99 mg/dL    Hx of GERD - takes prilosec QD. Takes every day, as if misses - has recurrnce of heartburn. Off meds for 1 week - feels more heartburn.   Review of Systems  Gastrointestinal:       Intermittent hemorrhoids, heartburn if not taking protonix.   All other systems reviewed and are negative.   As above.     Objective:   Physical Exam  Vitals reviewed. Constitutional: He is oriented to person, place, and time. He appears well-developed and well-nourished.  HENT:  Head:  Normocephalic and atraumatic.  Right Ear: External ear normal.  Left Ear: External ear normal.  Mouth/Throat: Oropharynx is clear and moist.  Eyes: Conjunctivae normal and EOM are normal. Pupils are equal, round, and reactive to light.  Neck: Normal range of motion. Neck supple. No thyromegaly present.  Cardiovascular: Normal rate, regular rhythm, normal heart sounds and intact distal pulses.   Pulmonary/Chest: Effort normal and breath sounds normal. No respiratory distress. He has no wheezes.  Abdominal: Soft. He exhibits no distension. There is no tenderness. Hernia confirmed negative in the right inguinal area and confirmed negative in the left inguinal area.  Genitourinary: Rectal exam shows external hemorrhoid. Rectal exam shows no fissure and no tenderness.     Musculoskeletal: Normal range of motion. He exhibits no edema and no tenderness.  Lymphadenopathy:    He has no cervical adenopathy.  Neurological: He is alert and oriented to person, place, and time. He has normal reflexes.  Skin: Skin is  warm and dry.  Psychiatric: He has a normal mood and affect. His behavior is normal.          Assessment & Plan:  Roger Long is a 47 y.o. male 1. HTN (hypertension)  Comprehensive metabolic panel, lisinopril (PRINIVIL,ZESTRIL) 20 MG tablet, pravastatin (PRAVACHOL) 10 MG tablet  2. Hyperlipidemia  Comprehensive metabolic panel, Lipid panel, pravastatin (PRAVACHOL) 10 MG tablet  3. GERD (gastroesophageal reflux disease)  CBC, pantoprazole (PROTONIX) 40 MG tablet, Ambulatory referral to Gastroenterology  4. Hemorrhoids  CBC, hydrocortisone (ANUSOL-HC) 2.5 % rectal cream  5. Annual physical exam  Comprehensive metabolic panel, Lipid panel, CBC    HTN- borderline - but home bp's ok. meds refilled.   Hemorrhoids - handout as below.  anusol if needed, rtc if recurs after changes in handout.   Hyperlipidemia - tolerating meds - refilled for 6 months.  Check labs above.    GERD -  recurrent, chronic- with dependence on PPI - will refer to GI for eval +/- endoscopy.

## 2013-01-09 NOTE — Progress Notes (Signed)
  Subjective:    Patient ID: Roger Long, male    DOB: 12-02-1966, 47 y.o.   MRN: [id number]  HPI    Review of Systems  Constitutional: Negative.   HENT: Negative.   Eyes: Negative.   Respiratory: Negative.   Cardiovascular: Negative.   Gastrointestinal: Negative.   Genitourinary: Negative.   Musculoskeletal: Negative.   Skin: Negative.   Neurological: Negative.   Hematological: Negative.   Psychiatric/Behavioral: Negative.        Objective:   Physical Exam        Assessment & Plan:

## 2013-02-14 ENCOUNTER — Other Ambulatory Visit: Payer: Self-pay | Admitting: Internal Medicine

## 2013-08-25 ENCOUNTER — Encounter: Payer: Self-pay | Admitting: Radiology

## 2013-08-25 DIAGNOSIS — K219 Gastro-esophageal reflux disease without esophagitis: Secondary | ICD-10-CM

## 2013-08-28 ENCOUNTER — Other Ambulatory Visit: Payer: Self-pay | Admitting: Family Medicine
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
## 2014-02-03 ENCOUNTER — Other Ambulatory Visit: Payer: Self-pay | Admitting: Physician Assistant

## 2014-02-16 ENCOUNTER — Other Ambulatory Visit: Payer: Self-pay | Admitting: Family Medicine

## 2014-03-02 ENCOUNTER — Ambulatory Visit (INDEPENDENT_AMBULATORY_CARE_PROVIDER_SITE_OTHER): Payer: BC Managed Care – PPO | Admitting: Emergency Medicine

## 2014-03-02 VITALS — BP 124/84 | HR 84 | Temp 98.0°F | Resp 16 | Ht 62.5 in | Wt 135.0 lb

## 2014-03-02 DIAGNOSIS — I10 Essential (primary) hypertension: Secondary | ICD-10-CM

## 2014-03-02 DIAGNOSIS — K219 Gastro-esophageal reflux disease without esophagitis: Secondary | ICD-10-CM

## 2014-03-02 LAB — POCT CBC
Granulocyte percent: 59.5 %G (ref 37–80)
HCT, POC: 45.8 % (ref 43.5–53.7)
Hemoglobin: 14.7 g/dL (ref 14.1–18.1)
LYMPH, POC: 2.7 (ref 0.6–3.4)
MCH, POC: 30.1 pg (ref 27–31.2)
MCHC: 32.1 g/dL (ref 31.8–35.4)
MCV: 93.6 fL (ref 80–97)
MID (CBC): 0.6 (ref 0–0.9)
MPV: 8.7 fL (ref 0–99.8)
PLATELET COUNT, POC: 341 10*3/uL (ref 142–424)
POC GRANULOCYTE: 4.8 (ref 2–6.9)
POC LYMPH %: 33.7 % (ref 10–50)
POC MID %: 6.8 % (ref 0–12)
RBC: 4.89 M/uL (ref 4.69–6.13)
RDW, POC: 14.1 %
WBC: 8.1 10*3/uL (ref 4.6–10.2)

## 2014-03-02 LAB — COMPREHENSIVE METABOLIC PANEL
ALK PHOS: 63 U/L (ref 39–117)
ALT: 22 U/L (ref 0–53)
AST: 17 U/L (ref 0–37)
Albumin: 4.4 g/dL (ref 3.5–5.2)
BILIRUBIN TOTAL: 0.2 mg/dL (ref 0.2–1.2)
BUN: 15 mg/dL (ref 6–23)
CO2: 28 mEq/L (ref 19–32)
Calcium: 8.8 mg/dL (ref 8.4–10.5)
Chloride: 99 mEq/L (ref 96–112)
Creat: 0.73 mg/dL (ref 0.50–1.35)
Glucose, Bld: 127 mg/dL — ABNORMAL HIGH (ref 70–99)
Potassium: 4 mEq/L (ref 3.5–5.3)
SODIUM: 142 meq/L (ref 135–145)
TOTAL PROTEIN: 7.3 g/dL (ref 6.0–8.3)

## 2014-03-02 MED ORDER — LISINOPRIL 10 MG PO TABS: 10.0000 mg | ORAL_TABLET | Freq: Every day | ORAL | Status: DC

## 2014-03-02 MED ORDER — PANTOPRAZOLE SODIUM 40 MG PO TBEC
DELAYED_RELEASE_TABLET | ORAL | Status: DC
Start: 2014-03-02 — End: 2014-04-06

## 2014-03-02 NOTE — Patient Instructions (Signed)
Take one tablet of each pill daily.

## 2014-03-02 NOTE — Progress Notes (Signed)
   Subjective:    Patient ID: Roger Long, male    DOB: Dec 17, 1965, 48 y.o.   MRN: [id number]  HPI 48 y.o. Male presents to clinic for a refill of medications. Has been out of blood pressure medication for the past 2 weeks.   Reports that he still has some trouble with reflux. Saw Dr. Benson Norway for this and had a endoscopy, was told that it was normal.   Review of Systems     Objective:   Physical Exam HEENT exam is unremarkable neck is supple chest clear heart regular rate without murmurs rubs or gallops. Abdomen is soft and nontender.  EKG normal sinus rhythm      Assessment & Plan:  Baseline labs were done. I did not do a lipid panel because patient was not fasting and he is not taking his lipid medication. I refilled his lisinopril at 10 mg a day and Protonix at 40 mg a day.

## 2014-04-06 ENCOUNTER — Ambulatory Visit (INDEPENDENT_AMBULATORY_CARE_PROVIDER_SITE_OTHER): Payer: BC Managed Care – PPO | Admitting: Internal Medicine

## 2014-04-06 ENCOUNTER — Encounter: Payer: Self-pay | Admitting: Internal Medicine

## 2014-04-06 ENCOUNTER — Other Ambulatory Visit (INDEPENDENT_AMBULATORY_CARE_PROVIDER_SITE_OTHER): Payer: BC Managed Care – PPO

## 2014-04-06 VITALS — BP 110/80 | HR 90 | Temp 97.9°F | Ht 62.0 in | Wt 134.2 lb

## 2014-04-06 DIAGNOSIS — I10 Essential (primary) hypertension: Secondary | ICD-10-CM

## 2014-04-06 DIAGNOSIS — E785 Hyperlipidemia, unspecified: Secondary | ICD-10-CM

## 2014-04-06 DIAGNOSIS — R7309 Other abnormal glucose: Secondary | ICD-10-CM

## 2014-04-06 DIAGNOSIS — Z0000 Encounter for general adult medical examination without abnormal findings: Secondary | ICD-10-CM

## 2014-04-06 DIAGNOSIS — R7302 Impaired glucose tolerance (oral): Secondary | ICD-10-CM

## 2014-04-06 LAB — CBC WITH DIFFERENTIAL/PLATELET
BASOS PCT: 0.4 % (ref 0.0–3.0)
Basophils Absolute: 0 10*3/uL (ref 0.0–0.1)
EOS ABS: 0.4 10*3/uL (ref 0.0–0.7)
Eosinophils Relative: 4.4 % (ref 0.0–5.0)
HEMATOCRIT: 46.5 % (ref 39.0–52.0)
Hemoglobin: 15.4 g/dL (ref 13.0–17.0)
Lymphocytes Relative: 38.4 % (ref 12.0–46.0)
Lymphs Abs: 3.7 10*3/uL (ref 0.7–4.0)
MCHC: 33.1 g/dL (ref 30.0–36.0)
MCV: 90.4 fl (ref 78.0–100.0)
MONO ABS: 0.8 10*3/uL (ref 0.1–1.0)
Monocytes Relative: 8.7 % (ref 3.0–12.0)
NEUTROS PCT: 48.1 % (ref 43.0–77.0)
Neutro Abs: 4.6 10*3/uL (ref 1.4–7.7)
PLATELETS: 336 10*3/uL (ref 150.0–400.0)
RBC: 5.14 Mil/uL (ref 4.22–5.81)
RDW: 12.9 % (ref 11.5–14.6)
WBC: 9.6 10*3/uL (ref 4.5–10.5)

## 2014-04-06 LAB — LIPID PANEL
Cholesterol: 209 mg/dL — ABNORMAL HIGH (ref 0–200)
HDL: 35 mg/dL — ABNORMAL LOW (ref >39.00)
LDL CALC: 103 mg/dL — AB (ref 0–99)
Total CHOL/HDL Ratio: 6
Triglycerides: 356 mg/dL — ABNORMAL HIGH (ref 0.0–149.0)
VLDL: 71.2 mg/dL — AB (ref 0.0–40.0)

## 2014-04-06 LAB — URINALYSIS, ROUTINE W REFLEX MICROSCOPIC
BILIRUBIN URINE: NEGATIVE
HGB URINE DIPSTICK: NEGATIVE
Ketones, ur: NEGATIVE
Leukocytes, UA: NEGATIVE
Nitrite: NEGATIVE
Specific Gravity, Urine: 1.02 (ref 1.000–1.030)
Total Protein, Urine: NEGATIVE
URINE GLUCOSE: NEGATIVE
Urobilinogen, UA: 0.2 (ref 0.0–1.0)
pH: 8 (ref 5.0–8.0)

## 2014-04-06 LAB — TSH: TSH: 2.2 u[IU]/mL (ref 0.35–5.50)

## 2014-04-06 LAB — HEPATIC FUNCTION PANEL
ALT: 29 U/L (ref 0–53)
AST: 21 U/L (ref 0–37)
Albumin: 4.3 g/dL (ref 3.5–5.2)
Alkaline Phosphatase: 64 U/L (ref 39–117)
BILIRUBIN DIRECT: 0 mg/dL (ref 0.0–0.3)
BILIRUBIN TOTAL: 0.5 mg/dL (ref 0.3–1.2)
Total Protein: 7.2 g/dL (ref 6.0–8.3)

## 2014-04-06 LAB — BASIC METABOLIC PANEL
BUN: 14 mg/dL (ref 6–23)
CO2: 28 mEq/L (ref 19–32)
Calcium: 9.6 mg/dL (ref 8.4–10.5)
Chloride: 100 mEq/L (ref 96–112)
Creatinine, Ser: 0.8 mg/dL (ref 0.4–1.5)
GFR: 115.67 mL/min (ref >60.00)
Glucose, Bld: 96 mg/dL (ref 70–99)
Potassium: 4.5 mEq/L (ref 3.5–5.1)
Sodium: 137 mEq/L (ref 135–145)

## 2014-04-06 LAB — PSA: PSA: 0.42 ng/mL (ref 0.10–4.00)

## 2014-04-06 LAB — HEMOGLOBIN A1C: HEMOGLOBIN A1C: 5.9 % (ref 4.6–6.5)

## 2014-04-06 MED ORDER — PRAVASTATIN SODIUM 10 MG PO TABS: 10.0000 mg | ORAL_TABLET | Freq: Every day | ORAL | Status: DC

## 2014-04-06 MED ORDER — FEXOFENADINE HCL 180 MG PO TABS: 180.0000 mg | ORAL_TABLET | Freq: Every day | ORAL | Status: DC

## 2014-04-06 MED ORDER — PANTOPRAZOLE SODIUM 40 MG PO TBEC: DELAYED_RELEASE_TABLET | ORAL | Status: DC

## 2014-04-06 MED ORDER — FLUTICASONE PROPIONATE 50 MCG/ACT NA SUSP: NASAL | Status: DC

## 2014-04-06 MED ORDER — LISINOPRIL 10 MG PO TABS: 10.0000 mg | ORAL_TABLET | Freq: Every day | ORAL | Status: DC

## 2014-04-06 NOTE — Progress Notes (Signed)
Pre visit review using our clinic review tool, if applicable. No additional management support is needed unless otherwise documented below in the visit note. 

## 2014-04-06 NOTE — Assessment & Plan Note (Signed)

## 2014-04-06 NOTE — Patient Instructions (Signed)

## 2014-04-06 NOTE — Assessment & Plan Note (Signed)
Asympt,  to f/u any worsening symptoms or concerns, for a1c today 

## 2014-04-06 NOTE — Progress Notes (Signed)
Subjective:    Patient ID: Roger Long, male    DOB: 1966/07/28, 48 y.o.   MRN: [id number]  HPI  Here for wellness and f/u with interpretor present, pt speaks fair english and understands much of what is said;  Overall doing ok;  Pt denies CP, worsening SOB, DOE, wheezing, orthopnea, PND, worsening LE edema, palpitations, dizziness or syncope.  Pt denies neurological change such as new headache, facial or extremity weakness.  Pt denies polydipsia, polyuria, or low sugar symptoms. Pt states overall good compliance with treatment and medications, good tolerability, and has been trying to follow lower cholesterol diet.  Pt denies worsening depressive symptoms, suicidal ideation or panic. No fever, night sweats, wt loss, loss of appetite, or other constitutional symptoms.  Pt states good ability with ADL's, has low fall risk, home safety reviewed and adequate, no other significant changes in hearing or vision, and only occasionally active with exercise. No current complaints Past Medical History  Diagnosis Date  . ALLERGIC RHINITIS 07/19/2009  . BACK PAIN 07/19/2009  . GERD 08/06/2010  . Headache(784.0) 08/06/2010  . HYPERLIPIDEMIA 08/06/2010  . PLANTAR FASCIITIS 07/19/2009  . Impaired glucose tolerance 09/26/2011  . Hypertension    History reviewed. No pertinent past surgical history.  reports that he has never smoked. He does not have any smokeless tobacco history on file. He reports that he drinks alcohol. He reports that he does not use illicit drugs. family history includes Hypertension in his mother; Stroke in his mother. No Known Allergies Current Outpatient Prescriptions on File Prior to Visit  Medication Sig Dispense Refill  . fluticasone (FLONASE) 50 MCG/ACT nasal spray USE 2 SPRAYS IN EACH NOSTRIL EVERY DAY  16 g  6  . hydrocortisone (ANUSOL-HC) 2.5 % rectal cream Place rectally 2 (two) times daily as needed for hemorrhoids.  30 g  0  . lisinopril (PRINIVIL,ZESTRIL) 10 MG tablet Take 1 tablet  (10 mg total) by mouth daily.  30 tablet  11  . pantoprazole (PROTONIX) 40 MG tablet Take one tablet daily  30 tablet  11  . pravastatin (PRAVACHOL) 10 MG tablet Take 1 tablet (10 mg total) by mouth daily.  90 tablet  1   No current facility-administered medications on file prior to visit.   Review of Systems Constitutional: Negative for increased diaphoresis, other activity, appetite or other siginficant weight change  HENT: Negative for worsening hearing loss, ear pain, facial swelling, mouth sores and neck stiffness.   Eyes: Negative for other worsening pain, redness or visual disturbance.  Respiratory: Negative for shortness of breath and wheezing.   Cardiovascular: Negative for chest pain and palpitations.  Gastrointestinal: Negative for diarrhea, blood in stool, abdominal distention or other pain Genitourinary: Negative for hematuria, flank pain or change in urine volume.  Musculoskeletal: Negative for myalgias or other joint complaints.  Skin: Negative for color change and wound.  Neurological: Negative for syncope and numbness. other than noted Hematological: Negative for adenopathy. or other swelling Psychiatric/Behavioral: Negative for hallucinations, self-injury, decreased concentration or other worsening agitation.      Objective:   Physical Exam BP 110/80  Pulse 90  Temp(Src) 97.9 F (36.6 C) (Oral)  Ht 5\' 2"  (1.575 m)  Wt 134 lb 4 oz (60.895 kg)  BMI 24.55 kg/m2  SpO2 97% VS noted,  Constitutional: Pt is oriented to person, place, and time. Appears well-developed and well-nourished.  Head: Normocephalic and atraumatic.  Right Ear: External ear normal.  Left Ear: External ear normal.  Nose: Nose normal.  Mouth/Throat: Oropharynx is clear and moist.  Eyes: Conjunctivae and EOM are normal. Pupils are equal, round, and reactive to light.  Neck: Normal range of motion. Neck supple. No JVD present. No tracheal deviation present.  Cardiovascular: Normal rate, regular  rhythm, normal heart sounds and intact distal pulses.   Pulmonary/Chest: Effort normal and breath sounds without rales or wheezing  Abdominal: Soft. Bowel sounds are normal. NT. No HSM  Musculoskeletal: Normal range of motion. Exhibits no edema.  Lymphadenopathy:  Has no cervical adenopathy.  Neurological: Pt is alert and oriented to person, place, and time. Pt has normal reflexes. No cranial nerve deficit. Motor grossly intact Skin: Skin is warm and dry. No rash noted.  Psychiatric:  Has normal mood and affect. Behavior is normal.     Assessment & Plan:

## 2014-04-11 ENCOUNTER — Encounter: Payer: Self-pay | Admitting: Internal Medicine

## 2015-02-20 ENCOUNTER — Ambulatory Visit (INDEPENDENT_AMBULATORY_CARE_PROVIDER_SITE_OTHER): Payer: BLUE CROSS/BLUE SHIELD | Admitting: Family Medicine

## 2015-02-20 VITALS — BP 124/78 | HR 120 | Temp 99.8°F | Resp 17 | Ht 62.5 in | Wt 138.0 lb

## 2015-02-20 DIAGNOSIS — R05 Cough: Secondary | ICD-10-CM

## 2015-02-20 DIAGNOSIS — R0981 Nasal congestion: Secondary | ICD-10-CM | POA: Diagnosis not present

## 2015-02-20 DIAGNOSIS — R509 Fever, unspecified: Secondary | ICD-10-CM | POA: Diagnosis not present

## 2015-02-20 DIAGNOSIS — J111 Influenza due to unidentified influenza virus with other respiratory manifestations: Secondary | ICD-10-CM | POA: Diagnosis not present

## 2015-02-20 DIAGNOSIS — R059 Cough, unspecified: Secondary | ICD-10-CM

## 2015-02-20 MED ORDER — IPRATROPIUM BROMIDE 0.03 % NA SOLN: 2.0000 | Freq: Two times a day (BID) | NASAL | Status: DC

## 2015-02-20 MED ORDER — HYDROCODONE-HOMATROPINE 5-1.5 MG/5ML PO SYRP: 5.0000 mL | ORAL_SOLUTION | ORAL | Status: DC | PRN

## 2015-02-20 NOTE — Progress Notes (Signed)
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 He has been sick since yesterday. He has had congestion, some cough, fever, generalized malaise, dizziness, body aches, and headache. He keeps having chills. No one else at home is ill.  Objective: He did have his flu shot back in September. His TMs are normal. Nose congested. Throat clear. Neck supple without nodes. Chest clear. Heart regular without murmurs. He looks like he does not feel well.  Assessment: Influenza despite flu vaccine Head congestion Cough Generalized malaise Fever  Plan: Cough syrup, Atrovent nasal, rest

## 2015-02-20 NOTE — Patient Instructions (Addendum)
Drink lots of fluids and get plenty of rest  Stay out of work tomorrow  Usually infection lasts for up to a week or so. However since you had the flu shot I hope that it will be not as severe and will not last as long.  Take the cough syrup 1 teaspoon every 4-6 hours when needed for cough  Use the nose spray 2 sprays each nostril 4 times daily when needed for head congestion  Take Tylenol 500 mg 2 tablets 3 times daily or ibuprofen 200 mg 3 tablets 3 times daily if needed for fever or aching. If you have taken one of the medicines and 4 hours later are still feeling bad you can try taking the other and alternating them back and forth.  Do not take any aspirin when you have the flu.  Return at any time if worse

## 2015-04-29 ENCOUNTER — Other Ambulatory Visit: Payer: Self-pay

## 2015-04-29 MED ORDER — PANTOPRAZOLE SODIUM 40 MG PO TBEC: DELAYED_RELEASE_TABLET | ORAL | Status: DC

## 2015-06-06 ENCOUNTER — Other Ambulatory Visit: Payer: Self-pay | Admitting: Internal Medicine

## 2015-06-14 ENCOUNTER — Ambulatory Visit (INDEPENDENT_AMBULATORY_CARE_PROVIDER_SITE_OTHER): Payer: BLUE CROSS/BLUE SHIELD | Admitting: Family Medicine

## 2015-06-14 VITALS — BP 118/80 | HR 83 | Temp 98.2°F | Resp 18 | Ht 63.0 in | Wt 133.0 lb

## 2015-06-14 DIAGNOSIS — R05 Cough: Secondary | ICD-10-CM | POA: Diagnosis not present

## 2015-06-14 DIAGNOSIS — J029 Acute pharyngitis, unspecified: Secondary | ICD-10-CM | POA: Diagnosis not present

## 2015-06-14 DIAGNOSIS — J069 Acute upper respiratory infection, unspecified: Secondary | ICD-10-CM

## 2015-06-14 DIAGNOSIS — R059 Cough, unspecified: Secondary | ICD-10-CM

## 2015-06-14 MED ORDER — HYDROCODONE-HOMATROPINE 5-1.5 MG/5ML PO SYRP: 5.0000 mL | ORAL_SOLUTION | ORAL | Status: DC | PRN

## 2015-06-14 NOTE — Patient Instructions (Signed)
Drink plenty of fluids and get enough rest  Take over-the-counter Zyrtec-D (cetirizine D) one daily for head congestion and sore throat  Take over-the-counter Tylenol (acetaminophen) 500 mg 2 pills 3 times daily as needed for sore throat  Take Hycodan syrup 1 teaspoon every 6 hours as needed for cough and sore throat  Return if worse or not improving

## 2015-06-14 NOTE — Progress Notes (Signed)
  Subjective:  Patient ID: Roger Long, male    DOB: [birth year]  Age: 49 y.o. MRN: [id number]  Patient is here with a sore throat, cough, nasal congestion. He was here 3 or 4 months ago for the same kind of thing. Patient had a sore throat develop yesterday and it still hurts. He has had some head congestion nasal drainage. He is started coughing, not much productivity. He does not smoke. Others at home was sick last week. He does not have fevers.   Objective:   No major distress. Some cough and sniffles. His TMs are normal. Nose congested. Throat not erythematous. Neck is supple without major nodes. He is tender his neck. Chest is clear to auscultation. Heart regular without murmur. No wheezing.  Assessment & Plan:   Assessment: Upper respiratory infection  Plan: Treat symptomatically There are no Patient Instructions on file for this visit.   Rashana Andrew, MD 06/14/2015

## 2015-07-05 ENCOUNTER — Encounter: Payer: Self-pay | Admitting: Internal Medicine

## 2015-08-01 ENCOUNTER — Ambulatory Visit (INDEPENDENT_AMBULATORY_CARE_PROVIDER_SITE_OTHER): Payer: BLUE CROSS/BLUE SHIELD | Admitting: Family Medicine

## 2015-08-01 VITALS — BP 112/74 | HR 85 | Temp 98.5°F | Resp 18 | Ht 62.0 in | Wt 134.0 lb

## 2015-08-01 DIAGNOSIS — I889 Nonspecific lymphadenitis, unspecified: Secondary | ICD-10-CM

## 2015-08-01 DIAGNOSIS — M542 Cervicalgia: Secondary | ICD-10-CM

## 2015-08-01 LAB — POCT CBC
GRANULOCYTE PERCENT: 62.2 % (ref 37–80)
HCT, POC: 47.1 % (ref 43.5–53.7)
Hemoglobin: 14.6 g/dL (ref 14.1–18.1)
Lymph, poc: 2.8 (ref 0.6–3.4)
MCH: 27.7 pg (ref 27–31.2)
MCHC: 30.9 g/dL — AB (ref 31.8–35.4)
MCV: 89.6 fL (ref 80–97)
MID (cbc): 0.6 (ref 0–0.9)
MPV: 7.6 fL (ref 0–99.8)
PLATELET COUNT, POC: 337 10*3/uL (ref 142–424)
POC Granulocyte: 5.6 (ref 2–6.9)
POC LYMPH %: 31.5 % (ref 10–50)
POC MID %: 6.3 %M (ref 0–12)
RBC: 5.26 M/uL (ref 4.69–6.13)
RDW, POC: 13.7 %
WBC: 9 10*3/uL (ref 4.6–10.2)

## 2015-08-01 LAB — POCT RAPID STREP A (OFFICE): RAPID STREP A SCREEN: NEGATIVE

## 2015-08-01 MED ORDER — CEPHALEXIN 500 MG PO CAPS
500.0000 mg | ORAL_CAPSULE | Freq: Three times a day (TID) | ORAL | Status: DC
Start: 2015-08-01 — End: 2016-05-01

## 2015-08-01 NOTE — Patient Instructions (Signed)
Take cephalexin one pill 3 times daily  Take over-the-counter Aleve (naproxen) two pills twice daily  Return if not much better over the next 5-7 days

## 2015-08-01 NOTE — Progress Notes (Signed)
  Subjective:  Patient ID: Roger Long, male    DOB: [birth year]  Age: 49 y.o. MRN: [id number]  49 year old gentleman well-known to me. I have seen him twice in recent months. He started having some problems in his neck on Monday. He was okay 49 evening, then Monday he was hurting in the right side of his throat and neck. Tuesday it was worse. When seen it was a little bit better about the day may still be a little bit better but is bothering him quite a lot still. He has not been running a fever. He is a Health visitor. He does not smoke. No upper respiratory congestion or ear pain or cough.   Objective:   Throat mildly erythematous without exudate. Neck supple with right cervical lymphadenopathy medium size. Chest clear. Heart regular without murmurs  Assessment & Plan:   Assessment:  Neck pain and cervical adenitis  Plan:  Strep test and culture if necessary, CBC  Results for orders placed or performed in visit on 08/01/15  POCT CBC  Result Value Ref Range   WBC 9.0 4.6 - 10.2 K/uL   Lymph, poc 2.8 0.6 - 3.4   POC LYMPH PERCENT 31.5 10 - 50 %L   MID (cbc) 0.6 0 - 0.9   POC MID % 6.3 0 - 12 %M   POC Granulocyte 5.6 2 - 6.9   Granulocyte percent 62.2 37 - 80 %G   RBC 5.26 4.69 - 6.13 M/uL   Hemoglobin 14.6 14.1 - 18.1 g/dL   HCT, POC 47.1 43.5 - 53.7 %   MCV 89.6 80 - 97 fL   MCH, POC 27.7 27 - 31.2 pg   MCHC 30.9 (A) 31.8 - 35.4 g/dL   RDW, POC 13.7 %   Platelet Count, POC 337 142 - 424 K/uL   MPV 7.6 0 - 99.8 fL  POCT rapid strep A  Result Value Ref Range   Rapid Strep A Screen Negative Negative     Patient Instructions  Take cephalexin one pill 3 times daily  Take over-the-counter Aleve (naproxen) two pills twice daily  Return if not much better over the next 5-7 days     Donyell Carrell, MD 08/01/2015

## 2015-08-03 LAB — CULTURE, GROUP A STREP: ORGANISM ID, BACTERIA: NORMAL

## 2015-08-09 ENCOUNTER — Ambulatory Visit (INDEPENDENT_AMBULATORY_CARE_PROVIDER_SITE_OTHER): Payer: Worker's Compensation | Admitting: Physician Assistant

## 2015-08-09 VITALS — BP 142/80 | HR 77 | Temp 98.3°F | Resp 18 | Ht 63.5 in | Wt 135.0 lb

## 2015-08-09 DIAGNOSIS — M79641 Pain in right hand: Secondary | ICD-10-CM | POA: Diagnosis not present

## 2015-08-09 DIAGNOSIS — Z1339 Encounter for screening examination for other mental health and behavioral disorders: Secondary | ICD-10-CM

## 2015-08-09 DIAGNOSIS — S61219A Laceration without foreign body of unspecified finger without damage to nail, initial encounter: Secondary | ICD-10-CM

## 2015-08-09 NOTE — Progress Notes (Signed)
Roger Long 06-28-1966 49 y.o.   Chief Complaint  Patient presents with  . Hand Pain    cut across middle and ring finger      Date of Injury: 08/09/2015    History of Present Illness:  Presents for evaluation of work-related complaint.  Patient is here today for chief complaint of laceration of his right middle and ring finger.  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  The edges are generally short prior, and as he turned it over, shifting from left hand to right.  His hand got trapped under the sharp edges of the lid.  He is utd on tetanus, 2010.  No numbness or tingling.  No loss of motion of fingers.     ROS ROS otherwise unremarkable unless listed above.   No Known Allergies   Current medications reviewed and updated. Past medical history, family history, social history have been reviewed and updated.  BP 142/80 mmHg  Pulse 77  Temp(Src) 98.3 F (36.8 C) (Oral)  Resp 18  Ht 5' 3.5" (1.613 m)  Wt 135 lb (61.236 kg)  BMI 23.54 kg/m2  SpO2 98%  Physical Exam  Constitutional: He is oriented to person, place, and time and well-developed, well-nourished, and in no distress. No distress.  HENT:  Head: Normocephalic and atraumatic.  Cardiovascular: Normal rate.   Pulmonary/Chest: Effort normal. No respiratory distress. He has no wheezes.  Musculoskeletal:  Normal range of motion of both fingers with sensation intact.  Normal radial pulse.  Laceration at both 3rd and fourth distal finger.  Deep diagnol laceration of both fingers. Tendons intact.    Neurological: He is alert and oriented to person, place, and time.  Skin: Skin is warm and dry.  Psychiatric: Affect and judgment normal.   Procedure: Verbal consent obtained.  1% Lidocaine placed for metacarpal block at both 3rd and 4th digit successful anesthesia.  Wound cleansed with both soap and water.  Both horizontally curved 2cm lacerations repaired with 5-0 ethilon in 13 simple interrupted sutures.  Rinsed  with normal saline.  Dressings applied.   Assessment and Plan: 49 year old male is here today for chief complaint of lacerations along 3rd and 4th distal fingers.  Laceration repaired without complication.  WC restriction made of no use of right hand first 3 days, then he may rt to full use, providing that he has bandaging and padding to guard impact and sutures placed at all times.  Advised to remove current coverings in 24 hours.  Wash twice per day with additional cleansing if wound gets dirty.  No submersion of lacerations in water.  RTC in 7 days for suture removal.  Alarming sxs to warrant immediate rtn given.  Laceration of fingers without complication, initial encounter  Alcohol screening - Plan: CANCELED: Ethanol  Ivar Drape, PA-C [facility] 8/26/201612:44 PM

## 2015-08-09 NOTE — Patient Instructions (Signed)
Take the bandage off in 24 hours.   Keep the wound clean with soap and water twice per day.  You will have to wash it more if it gets dirty. No use of the right hand, the first 48 hours.   Then you can resume but place coban and padding, so this is not painful to use and so that the sutures will not be bothered.   Please return to the clinic in 7 days.    Ch?m [employer] V?t [facility]??[facility]hu Ch? (Sutured Wound Care) Khu ch? l cc m?i khu c th? ???c s? d?ng ?? ?ng v?t th??ng. Ch?m [place] v?t th??ng gip ng?n ng?a ?au v nhi?m trng.  H??NG D?N CH?M [place] T?I [facility]  Ngh? ng?i v nng cao vng b? th??ng cho ??n khi h?t ?au v s?ng.  Ch? s? d?ng thu?c khng c?n k toa ho?c thu?c c?n k toa ?? gi?m ?au, gi?m c?m gic kh ch?u ho?c h? s?t theo ch? d?n c?a chuyn gia ch?m [place] s?c kh?e c?a b?n.  Sau 48 gi?, hy r?a nh? vng ny b?ng x bng nh? v n??c m?t l?n m?i ngy ho?c theo ch? d?n. R?a s?ch x phng. V? nh? ?? lm kh vng ngy b?ng kh?n s?ch. Khng ch xt v?t th??ng. Lm nh? v?y c th? gy ch?y mu.  Th?c hi?n theo h??ng d?n c?a chuyn gia ch?m [place] s?c kh?e v? t?n su?t thay b?ng. Ng?ng s? d?ng b?ng sau 2 ngy ho?c sau khi v?t th??ng ng?ng r? n??c.  N?u b?ng b? dnh, hy lm ?m b?ng b?ng n??c x phng v nh? nhng tho b?ng.  Thoa thu?c m? ln v?t th??ng theo ch? d?n.  Trnh ko c?ng v?t th??ng khu ch?.  U?ng ?? n??c ?? gi? cho n??c ti?u trong ho?c vng nh?t.  Hy g?p chuyn gia ch?m [place] s?c kh?e ?? khm l?i v c?t ch? theo h??ng d?n.  Thoa kem ch?ng n?ng ln v?t th??ng trong 3 ??n 6 thng ti?p theo ?? v?t s?o s? khng thm. HY NGAY L?P T?C ?I KHM N?U:  V?t th??ng c?a b?n tr? nn ??, s?ng, nng ho?c nh?y c?m ?au.  B?n b? ?au gia t?ng ? v?t th??ng.  B?n c v?t ?? ch?y ra t? v?t th??ng.  C m? t? v?t th??ng.  B?n b? s?t.  B?n b? l?nh run.  C mi hi t? v?t th??ng.  B?n b? lin t?c ch?y mu t? v?t th??ng. ??M B?O B?N:  Hi?u cc h??ng d?n ny.  S? theo di tnh tr?ng c?a  mnh.  S? yu c?u tr? gip ngay l?p t?c n?u b?n c?m th?y khng ?? ho?c tnh tr?ng tr?m tr?ng h?n. Document Released: 11/19/2011 Document Revised: 08/02/2013 [facility] Patient Information 2015 [place]. This information is not intended to replace advice given to you by your health care provider. Make sure you discuss any questions you have with your health care provider.

## 2015-08-16 ENCOUNTER — Ambulatory Visit (INDEPENDENT_AMBULATORY_CARE_PROVIDER_SITE_OTHER): Payer: Worker's Compensation | Admitting: Physician Assistant

## 2015-08-16 VITALS — BP 132/76 | HR 83 | Temp 98.6°F | Resp 16 | Ht 63.5 in | Wt 135.0 lb

## 2015-08-16 DIAGNOSIS — Z4802 Encounter for removal of sutures: Secondary | ICD-10-CM

## 2015-08-19 NOTE — Progress Notes (Signed)
Roger Long July 11, 1966 49 y.o.   Chief Complaint  Patient presents with  . Suture / Staple Removal  . Flu Vaccine     Date of Injury: 08/09/2015  History of Present Illness:  Presents for evaluation of work-related complaint.  Patient is here today for suture removal after 1 week suture placement of laceration at 3rd and 4th finger.  Patient reports that he has had no increased swelling, pain, or redness at the fingers.  No loss of sensation.  He has been working but not using his left hand.     ROS ROS otherwise unremarkable unless listed above.    No Known Allergies   Current medications reviewed and updated. Past medical history, family history, social history have been reviewed and updated.   Physical Exam  Constitutional: He is well-developed, well-nourished, and in no distress. No distress.  HENT:  Head: Normocephalic and atraumatic.  Eyes: Conjunctivae are normal. Pupils are equal, round, and reactive to light.  Neck: Normal range of motion.  Cardiovascular: Normal rate and regular rhythm.   Pulmonary/Chest: Effort normal and breath sounds normal. No respiratory distress.  Skin: He is not diaphoretic.  Sutures intact at 3rd and 4th digit.  No exudate, redness, or swelling.  Mild tenderness along suture.       Assessment and Plan: 49 year old male is here today for suture removal.  Sutures removed without complication, and minimal bleeding.  Advised to keep bandages to cushion use, and insure appropriate closure.   Restrictions minimal over the next 4 days, before release of restriction (see letter)  Visit for suture removal  Ivar Drape, PA-C [facility]

## 2015-08-19 NOTE — Addendum Note (Signed)
Addended by: Ivar Drape D on: 08/19/2015 12:47 PM   Modules accepted: Level of Service

## 2015-08-29 ENCOUNTER — Ambulatory Visit (INDEPENDENT_AMBULATORY_CARE_PROVIDER_SITE_OTHER): Payer: BLUE CROSS/BLUE SHIELD | Admitting: Internal Medicine

## 2015-08-29 ENCOUNTER — Encounter: Payer: Self-pay | Admitting: Internal Medicine

## 2015-08-29 ENCOUNTER — Other Ambulatory Visit (INDEPENDENT_AMBULATORY_CARE_PROVIDER_SITE_OTHER): Payer: BLUE CROSS/BLUE SHIELD

## 2015-08-29 VITALS — BP 114/78 | HR 83 | Ht 62.0 in | Wt 137.0 lb

## 2015-08-29 DIAGNOSIS — Z23 Encounter for immunization: Secondary | ICD-10-CM | POA: Diagnosis not present

## 2015-08-29 DIAGNOSIS — Z0000 Encounter for general adult medical examination without abnormal findings: Secondary | ICD-10-CM

## 2015-08-29 DIAGNOSIS — R7989 Other specified abnormal findings of blood chemistry: Secondary | ICD-10-CM | POA: Diagnosis not present

## 2015-08-29 DIAGNOSIS — R221 Localized swelling, mass and lump, neck: Secondary | ICD-10-CM | POA: Insufficient documentation

## 2015-08-29 LAB — BASIC METABOLIC PANEL
BUN: 21 mg/dL (ref 6–23)
CALCIUM: 8.9 mg/dL (ref 8.4–10.5)
CO2: 30 mEq/L (ref 19–32)
CREATININE: 0.95 mg/dL (ref 0.40–1.50)
Chloride: 104 mEq/L (ref 96–112)
GFR: 90.22 mL/min (ref >60.00)
Glucose, Bld: 94 mg/dL (ref 70–99)
Potassium: 4.7 mEq/L (ref 3.5–5.1)
SODIUM: 141 meq/L (ref 135–145)

## 2015-08-29 LAB — CBC WITH DIFFERENTIAL/PLATELET
BASOS ABS: 0.1 10*3/uL (ref 0.0–0.1)
BASOS PCT: 0.9 % (ref 0.0–3.0)
EOS PCT: 4.4 % (ref 0.0–5.0)
Eosinophils Absolute: 0.4 10*3/uL (ref 0.0–0.7)
HEMATOCRIT: 44.8 % (ref 39.0–52.0)
Hemoglobin: 14.8 g/dL (ref 13.0–17.0)
LYMPHS ABS: 3 10*3/uL (ref 0.7–4.0)
LYMPHS PCT: 31.7 % (ref 12.0–46.0)
MCHC: 33.1 g/dL (ref 30.0–36.0)
MCV: 90.4 fl (ref 78.0–100.0)
MONOS PCT: 7 % (ref 3.0–12.0)
Monocytes Absolute: 0.7 10*3/uL (ref 0.1–1.0)
NEUTROS ABS: 5.3 10*3/uL (ref 1.4–7.7)
NEUTROS PCT: 56 % (ref 43.0–77.0)
PLATELETS: 336 10*3/uL (ref 150.0–400.0)
RBC: 4.95 Mil/uL (ref 4.22–5.81)
RDW: 13.1 % (ref 11.5–15.5)
WBC: 9.4 10*3/uL (ref 4.0–10.5)

## 2015-08-29 LAB — URINALYSIS, ROUTINE W REFLEX MICROSCOPIC
BILIRUBIN URINE: NEGATIVE
HGB URINE DIPSTICK: NEGATIVE
Ketones, ur: NEGATIVE
Leukocytes, UA: NEGATIVE
Nitrite: NEGATIVE
PH: 7.5 (ref 5.0–8.0)
RBC / HPF: NONE SEEN (ref 0–?)
Specific Gravity, Urine: 1.015 (ref 1.000–1.030)
Total Protein, Urine: NEGATIVE
UROBILINOGEN UA: 0.2 (ref 0.0–1.0)
Urine Glucose: NEGATIVE
WBC UA: NONE SEEN (ref 0–?)

## 2015-08-29 LAB — LIPID PANEL
CHOL/HDL RATIO: 6
Cholesterol: 214 mg/dL — ABNORMAL HIGH (ref 0–200)
HDL: 38.8 mg/dL — ABNORMAL LOW (ref >39.00)
NONHDL: 175.66
TRIGLYCERIDES: 281 mg/dL — AB (ref 0.0–149.0)
VLDL: 56.2 mg/dL — ABNORMAL HIGH (ref 0.0–40.0)

## 2015-08-29 LAB — LDL CHOLESTEROL, DIRECT: LDL DIRECT: 142 mg/dL

## 2015-08-29 LAB — PSA: PSA: 0.86 ng/mL (ref 0.10–4.00)

## 2015-08-29 LAB — HEPATIC FUNCTION PANEL
ALK PHOS: 60 U/L (ref 39–117)
ALT: 20 U/L (ref 0–53)
AST: 15 U/L (ref 0–37)
Albumin: 4.3 g/dL (ref 3.5–5.2)
BILIRUBIN DIRECT: 0.1 mg/dL (ref 0.0–0.3)
BILIRUBIN TOTAL: 0.4 mg/dL (ref 0.2–1.2)
TOTAL PROTEIN: 7.2 g/dL (ref 6.0–8.3)

## 2015-08-29 LAB — TSH: TSH: 1.79 u[IU]/mL (ref 0.35–4.50)

## 2015-08-29 MED ORDER — PANTOPRAZOLE SODIUM 40 MG PO TBEC: DELAYED_RELEASE_TABLET | ORAL | Status: DC

## 2015-08-29 MED ORDER — LISINOPRIL 10 MG PO TABS: 10.0000 mg | ORAL_TABLET | Freq: Every day | ORAL | Status: DC

## 2015-08-29 NOTE — Assessment & Plan Note (Signed)

## 2015-08-29 NOTE — Progress Notes (Signed)
Pre visit review using our clinic review tool, if applicable. No additional management support is needed unless otherwise documented below in the visit note. 

## 2015-08-29 NOTE — Patient Instructions (Addendum)
You had the flu shot today  Please continue all other medications as before, and refills have been done if requested.  Please have the pharmacy call with any other refills you may need.  Please continue your efforts at being more active, low cholesterol diet, and weight control.  You are otherwise up to date with prevention measures today.  Please keep your appointments with your specialists as you may have planned  You will be contacted regarding the referral for: CT scan of the neck, and referral to ENT  Please go to the LAB in the Basement (turn left off the elevator) for the tests to be done today  You will be contacted by phone if any changes need to be made immediately.  Otherwise, you will receive a letter about your results with an explanation, but please check with MyChart first.  Please remember to sign up for MyChart if you have not done so, as this will be important to you in the future with finding out test results, communicating by private email, and scheduling acute appointments online when needed.  Please return in 1 year for your yearly visit, or sooner if needed, with Lab testing done 3-5 days before

## 2015-08-29 NOTE — Assessment & Plan Note (Signed)
Hopefully benign issue maybe recurrence of prior issue such as salivary gland obstruction, but cant r/o malignancy, doubt infection; for CT neck w CM, refer ENT, hold on empiric specific tx at this time

## 2015-08-29 NOTE — Progress Notes (Signed)
Subjective:    Patient ID: Roger Long, male    DOB: 1966-11-28, 49 y.o.   MRN: [id number]  HPI  Here for wellness and f/u;  Overall doing ok;  Pt denies Chest pain, worsening SOB, DOE, wheezing, orthopnea, PND, worsening LE edema, palpitations, dizziness or syncope.  Pt denies neurological change such as new headache, facial or extremity weakness.  Pt denies polydipsia, polyuria, or low sugar symptoms. Pt states overall good compliance with treatment and medications, good tolerability, and has been trying to follow appropriate diet.  Pt denies worsening depressive symptoms, suicidal ideation or panic. No fever, night sweats, wt loss, loss of appetite, or other constitutional symptoms.  Pt states good ability with ADL's, has low fall risk, home safety reviewed and adequate, no other significant changes in hearing or vision, and only occasionally active with exercise.  Does have also 1 mo onset right neck enlargement mostly under the jawline, and one area of tenderness to right mid neck just below most of the swelling.  No fever, trauama.  Did have some type of swelling to right neck in 2012 as well with CT neg for neck mass Past Medical History  Diagnosis Date  . ALLERGIC RHINITIS 07/19/2009  . BACK PAIN 07/19/2009  . GERD 08/06/2010  . Headache(784.0) 08/06/2010  . HYPERLIPIDEMIA 08/06/2010  . PLANTAR FASCIITIS 07/19/2009  . Impaired glucose tolerance 09/26/2011  . Hypertension    No past surgical history on file.  reports that he has quit smoking. He does not have any smokeless tobacco history on file. He reports that he drinks alcohol. He reports that he does not use illicit drugs. family history includes Hypertension in his mother; Stroke in his mother. No Known Allergies Current Outpatient Prescriptions on File Prior to Visit  Medication Sig Dispense Refill  . cephALEXin (KEFLEX) 500 MG capsule Take 1 capsule (500 mg total) by mouth 3 (three) times daily. 20 capsule 0  . fexofenadine (ALLEGRA) 180  MG tablet Take 1 tablet (180 mg total) by mouth daily. As needed for allergies 90 tablet 3  . fluticasone (FLONASE) 50 MCG/ACT nasal spray USE 2 SPRAYS IN EACH NOSTRIL EVERY DAY 16 g 6  . HYDROcodone-homatropine (HYCODAN) 5-1.5 MG/5ML syrup Take 5 mLs by mouth every 4 (four) hours as needed. 120 mL 0  . hydrocortisone (ANUSOL-HC) 2.5 % rectal cream Place rectally 2 (two) times daily as needed for hemorrhoids. 30 g 0  . ipratropium (ATROVENT) 0.03 % nasal spray Place 2 sprays into the nose 2 (two) times daily. 30 mL 0  . lisinopril (PRINIVIL,ZESTRIL) 10 MG tablet TAKE 1 TABLET BY MOUTH EVERY DAY 90 tablet 0  . pantoprazole (PROTONIX) 40 MG tablet Take one tablet daily 90 tablet 3  . pravastatin (PRAVACHOL) 10 MG tablet Take 1 tablet (10 mg total) by mouth daily. (Patient not taking: Reported on 06/14/2015) 90 tablet 3   No current facility-administered medications on file prior to visit.   Review of Systems Constitutional: Negative for increased diaphoresis, other activity, appetite or siginficant weight change other than noted HENT: Negative for worsening hearing loss, ear pain, facial swelling, mouth sores and neck stiffness.   Eyes: Negative for other worsening pain, redness or visual disturbance.  Respiratory: Negative for shortness of breath and wheezing  Cardiovascular: Negative for chest pain and palpitations.  Gastrointestinal: Negative for diarrhea, blood in stool, abdominal distention or other pain Genitourinary: Negative for hematuria, flank pain or change in urine volume.  Musculoskeletal: Negative for myalgias or other joint complaints.  Skin: Negative for color change and wound or drainage.  Neurological: Negative for syncope and numbness. other than noted Hematological: Negative for adenopathy. or other swelling Psychiatric/Behavioral: Negative for hallucinations, SI, self-injury, decreased concentration or other worsening agitation.      Objective:   Physical Exam BP 114/78  mmHg  Pulse 83  Ht 5\' 2"  (1.575 m)  Wt 137 lb (62.143 kg)  BMI 25.05 kg/m2  SpO2 95% VS noted, not ill appearing Constitutional: Pt is oriented to person, place, and time. Appears well-developed and well-nourished, in no significant distress Head: Normocephalic and atraumatic.  Right Ear: External ear normal.  Left Ear: External ear normal.  Nose: Nose normal.  Mouth/Throat: Oropharynx is clear and moist.  Eyes: Conjunctivae and EOM are normal. Pupils are equal, round, and reactive to light.  Neck: Normal range of motion. Neck supple. No JVD present. No tracheal deviation present but has unusual large 2-3 cm non discrete mass (or masses) to right submandibular/angle of jaw Cardiovascular: Normal rate, regular rhythm, normal heart sounds and intact distal pulses.   Pulmonary/Chest: Effort normal and breath sounds without rales or wheezing  Abdominal: Soft. Bowel sounds are normal. NT. No HSM  Musculoskeletal: Normal range of motion. Exhibits no edema.  Lymphadenopathy:  Has no cervical adenopathy.  Neurological: Pt is alert and oriented to person, place, and time. Pt has normal reflexes. No cranial nerve deficit. Motor grossly intact Skin: Skin is warm and dry. No rash noted.  Psychiatric:  Has normal mood and affect. Behavior is normal.     Assessment & Plan:

## 2015-08-30 ENCOUNTER — Other Ambulatory Visit: Payer: Self-pay | Admitting: Internal Medicine

## 2015-08-30 ENCOUNTER — Encounter: Payer: Self-pay | Admitting: Internal Medicine

## 2015-08-30 MED ORDER — PRAVASTATIN SODIUM 40 MG PO TABS: 40.0000 mg | ORAL_TABLET | Freq: Every day | ORAL | Status: DC

## 2015-09-09 ENCOUNTER — Telehealth: Payer: Self-pay | Admitting: Internal Medicine

## 2015-09-09 NOTE — Telephone Encounter (Signed)
Pt declined appt

## 2015-09-10 ENCOUNTER — Inpatient Hospital Stay: Admission: RE | Admit: 2015-09-10 | Payer: BLUE CROSS/BLUE SHIELD | Source: Ambulatory Visit

## 2016-01-27 ENCOUNTER — Ambulatory Visit (INDEPENDENT_AMBULATORY_CARE_PROVIDER_SITE_OTHER): Payer: BLUE CROSS/BLUE SHIELD | Admitting: Family Medicine

## 2016-01-27 VITALS — BP 120/78 | HR 82 | Temp 98.6°F | Resp 17 | Ht 63.78 in | Wt 139.2 lb

## 2016-01-27 DIAGNOSIS — M545 Low back pain, unspecified: Secondary | ICD-10-CM

## 2016-01-27 MED ORDER — CYCLOBENZAPRINE HCL 5 MG PO TABS: ORAL_TABLET | ORAL | Status: DC

## 2016-01-27 NOTE — Patient Instructions (Signed)

## 2016-01-27 NOTE — Progress Notes (Signed)
[facility] [address] [place] [address] [phone]  Date:  01/27/2016   Name:  Roger Long   DOB:  1965/12/26   MRN:  [id number]  PCP:  Cathlean Cower, MD    Chief Complaint: Back Pain   History of Present Illness:  Roger Long is a 50 y.o. very pleasant male patient who presents with the following: Low back pain:  - Began 2 days ago - 3rd episode of low back pain, over last 5 years.  Usually resolve on their own. Usually lays down for a few days and wife massages his back and it improves.  - This episode began as he got up from a chair.  Tried massage with warming lotion, which helped a little.  - Resting supine relieves pain.  - Walking movement worsens the pain.   - No red flag symptoms - No history of cancer or familial history of cancer.  - has tried several aleve, which don't provide much relief.  - No trauma   Patient Active Problem List   Diagnosis Date Noted  . Mass of right side of neck 08/29/2015  . Impaired glucose tolerance 09/26/2011  . Preventative health care 09/20/2011  . HYPERLIPIDEMIA 08/06/2010  . GERD 08/06/2010  . Headache(784.0) 08/06/2010  . ALLERGIC RHINITIS 07/19/2009  . BACK PAIN 07/19/2009  . PLANTAR FASCIITIS 07/19/2009    Past Medical History  Diagnosis Date  . ALLERGIC RHINITIS 07/19/2009  . BACK PAIN 07/19/2009  . GERD 08/06/2010  . Headache(784.0) 08/06/2010  . HYPERLIPIDEMIA 08/06/2010  . PLANTAR FASCIITIS 07/19/2009  . Impaired glucose tolerance 09/26/2011  . Hypertension     No past surgical history on file.  Social History  Substance Use Topics  . Smoking status: Former Research scientist (life sciences)  . Smokeless tobacco: None  . Alcohol Use: 0.0 oz/week    0 Standard drinks or equivalent per week     Comment: 3-4 beers  week    Family History  Problem Relation Age of Onset  . Hypertension Mother   . Stroke Mother     No Known Allergies  Medication list has been reviewed and updated.  Current Outpatient Prescriptions  on File Prior to Visit  Medication Sig Dispense Refill  . cephALEXin (KEFLEX) 500 MG capsule Take 1 capsule (500 mg total) by mouth 3 (three) times daily. 20 capsule 0  . fluticasone (FLONASE) 50 MCG/ACT nasal spray USE 2 SPRAYS IN EACH NOSTRIL EVERY DAY 16 g 6  . lisinopril (PRINIVIL,ZESTRIL) 10 MG tablet Take 1 tablet (10 mg total) by mouth daily. 90 tablet 3  . pantoprazole (PROTONIX) 40 MG tablet Take one tablet daily 90 tablet 3  . fexofenadine (ALLEGRA) 180 MG tablet Take 1 tablet (180 mg total) by mouth daily. As needed for allergies (Patient not taking: Reported on 01/27/2016) 90 tablet 3  . HYDROcodone-homatropine (HYCODAN) 5-1.5 MG/5ML syrup Take 5 mLs by mouth every 4 (four) hours as needed. (Patient not taking: Reported on 01/27/2016) 120 mL 0  . hydrocortisone (ANUSOL-HC) 2.5 % rectal cream Place rectally 2 (two) times daily as needed for hemorrhoids. (Patient not taking: Reported on 01/27/2016) 30 g 0  . ipratropium (ATROVENT) 0.03 % nasal spray Place 2 sprays into the nose 2 (two) times daily. (Patient not taking: Reported on 01/27/2016) 30 mL 0  . pravastatin (PRAVACHOL) 40 MG tablet Take 1 tablet (40 mg total) by mouth daily. (Patient not taking: Reported on 01/27/2016) 90 tablet 3   No current facility-administered medications on  file prior to visit.    Review of Systems:  Review of Systems  Constitutional: Negative for fever, chills and weight loss.  Respiratory: Negative for cough, shortness of breath and wheezing.   Cardiovascular: Negative for chest pain.  Gastrointestinal: Negative for nausea, vomiting, diarrhea and constipation.  Genitourinary: Negative for dysuria.  Musculoskeletal: Positive for back pain. Negative for myalgias, joint pain, falls and neck pain.  Skin: Negative for rash.  Neurological: Negative for dizziness, tingling, sensory change, weakness (no lower extremity weakness. ) and headaches.    Physical Examination: Filed Vitals:   01/27/16 1024  BP:  120/78  Pulse: 82  Temp: 98.6 F (37 C)  Resp: 17   Filed Vitals:   01/27/16 1024  Height: 5' 3.78" (1.62 m)  Weight: 139 lb 3.2 oz (63.141 kg)   Body mass index is 24.06 kg/(m^2). Ideal Body Weight: Weight in (lb) to have BMI = 25: 144.3  GEN: WDWN, NAD, Non-toxic, A & O x 3 CV: RRR, No M/G/R. No JVD. No thrill. No extra heart sounds. PULM: CTA B, no wheezes, crackles, rhonchi. No retractions. No resp. distress. No accessory muscle use. EXTR: No c/c/e NEURO Normal gait.  PSYCH: Normally interactive. Conversant. Not depressed or anxious appearing.  Calm demeanor.   Back Exam: Inspection: nml  Motion: limited 30 flexion/ext due to pain. Full rotation SLR seated:   neg                   SLR lying: neg XSLR seated:    neg                    XSLR lying: neg Palpable tenderness: b/l L3-S paraspinal tenderness.  Sensory change: none Reflex change:none  Strength at foot Plantar-flexion: 5 / 5    Dorsi-flexion: 5 / 5    Eversion: 5 / 5   Inversion:  5/ 5 Leg strength Quad:5  / 5   Hamstring: 5 / 5   Hip flexor: 5 / 5   Hip abductors: 5 / 5 Gait Walking:  nml         Heels:  nml         Toes: nml            Tandem: nml  Assessment and Plan: Axial low back pain, spasm:  - Recommended activity - Muscle relaxer PRN.  - Low back exercises when able.  - OTC NSAIDs - f/u if not resolving.   Signed Gerre Pebbles, MD

## 2016-05-01 ENCOUNTER — Ambulatory Visit (INDEPENDENT_AMBULATORY_CARE_PROVIDER_SITE_OTHER): Payer: BLUE CROSS/BLUE SHIELD | Admitting: Family Medicine

## 2016-05-01 VITALS — BP 134/90 | HR 85 | Temp 98.4°F | Ht 62.5 in | Wt 134.0 lb

## 2016-05-01 DIAGNOSIS — J029 Acute pharyngitis, unspecified: Secondary | ICD-10-CM

## 2016-05-01 MED ORDER — AMOXICILLIN 500 MG PO CAPS: 500.0000 mg | ORAL_CAPSULE | Freq: Three times a day (TID) | ORAL | Status: DC

## 2016-05-01 MED ORDER — CHLORHEXIDINE GLUCONATE 0.12 % MT SOLN: 15.0000 mL | Freq: Two times a day (BID) | OROMUCOSAL | Status: DC

## 2016-05-01 NOTE — Progress Notes (Signed)
This is a 50 year old [place] gentleman who comes in with 1 day of sore throat sweats, chills. He works in a Tree [employer]  He's had no cough, stiff neck, or ear pain. He also denies nausea, vomiting, rash  Objective:  BP 134/90 mmHg  Pulse 85  Temp(Src) 98.4 F (36.9 C) (Oral)  Ht 5' 2.5" (1.588 m)  Wt 134 lb (60.782 kg)  BMI 24.10 kg/m2  SpO2 97% No acute distress Throat: Markedly erythematous with no swelling or exudates TMs: Some scarring which is old on the right TM, otherwise negative Neck: Supple no adenopathy Chest: Clear no wheezing Heart: Regular no murmur Skin: No rash  Assessment:  Pharyngitis - Plan: amoxicillin (AMOXIL) 500 MG capsule, chlorhexidine (PERIDEX) 0.12 % solution  Robyn Haber, MD

## 2016-05-01 NOTE — Patient Instructions (Addendum)

## 2016-05-04 ENCOUNTER — Other Ambulatory Visit: Payer: Self-pay | Admitting: Family Medicine

## 2016-05-06 NOTE — Telephone Encounter (Signed)
Pt seen for Pharyngitis on 5/19. Don't see any mention in Dr Lenn Cal notes about nasal Sxs, but pt is requesting atrovent NS which had been Rxd to him for a previous cold. Do you want to give RF?

## 2016-08-23 DIAGNOSIS — Z23 Encounter for immunization: Secondary | ICD-10-CM | POA: Diagnosis not present

## 2016-08-29 ENCOUNTER — Other Ambulatory Visit: Payer: Self-pay | Admitting: Internal Medicine

## 2016-09-17 ENCOUNTER — Other Ambulatory Visit: Payer: Self-pay | Admitting: Internal Medicine

## 2016-09-26 ENCOUNTER — Other Ambulatory Visit: Payer: Self-pay | Admitting: Internal Medicine

## 2016-10-09 ENCOUNTER — Other Ambulatory Visit (INDEPENDENT_AMBULATORY_CARE_PROVIDER_SITE_OTHER): Payer: BLUE CROSS/BLUE SHIELD

## 2016-10-09 ENCOUNTER — Encounter: Payer: Self-pay | Admitting: Internal Medicine

## 2016-10-09 ENCOUNTER — Ambulatory Visit (INDEPENDENT_AMBULATORY_CARE_PROVIDER_SITE_OTHER): Payer: BLUE CROSS/BLUE SHIELD | Admitting: Internal Medicine

## 2016-10-09 VITALS — BP 132/72 | HR 74 | Temp 98.1°F | Resp 20 | Wt 135.5 lb

## 2016-10-09 DIAGNOSIS — Z0001 Encounter for general adult medical examination with abnormal findings: Secondary | ICD-10-CM

## 2016-10-09 DIAGNOSIS — R7302 Impaired glucose tolerance (oral): Secondary | ICD-10-CM

## 2016-10-09 DIAGNOSIS — M722 Plantar fascial fibromatosis: Secondary | ICD-10-CM

## 2016-10-09 DIAGNOSIS — E785 Hyperlipidemia, unspecified: Secondary | ICD-10-CM | POA: Diagnosis not present

## 2016-10-09 DIAGNOSIS — J309 Allergic rhinitis, unspecified: Secondary | ICD-10-CM | POA: Diagnosis not present

## 2016-10-09 LAB — LIPID PANEL
Cholesterol: 227 mg/dL — ABNORMAL HIGH (ref 0–200)
HDL: 41.7 mg/dL (ref >39.00)
LDL CALC: 148 mg/dL — AB (ref 0–99)
NonHDL: 185.13
Total CHOL/HDL Ratio: 5
Triglycerides: 186 mg/dL — ABNORMAL HIGH (ref 0.0–149.0)
VLDL: 37.2 mg/dL (ref 0.0–40.0)

## 2016-10-09 LAB — CBC WITH DIFFERENTIAL/PLATELET
BASOS ABS: 0 10*3/uL (ref 0.0–0.1)
Basophils Relative: 0.3 % (ref 0.0–3.0)
Eosinophils Absolute: 0.5 10*3/uL (ref 0.0–0.7)
Eosinophils Relative: 5 % (ref 0.0–5.0)
HCT: 45.7 % (ref 39.0–52.0)
HEMOGLOBIN: 15.3 g/dL (ref 13.0–17.0)
LYMPHS ABS: 3.3 10*3/uL (ref 0.7–4.0)
Lymphocytes Relative: 33.4 % (ref 12.0–46.0)
MCHC: 33.5 g/dL (ref 30.0–36.0)
MCV: 89.8 fl (ref 78.0–100.0)
MONO ABS: 0.7 10*3/uL (ref 0.1–1.0)
MONOS PCT: 6.9 % (ref 3.0–12.0)
NEUTROS PCT: 54.4 % (ref 43.0–77.0)
Neutro Abs: 5.3 10*3/uL (ref 1.4–7.7)
Platelets: 337 10*3/uL (ref 150.0–400.0)
RBC: 5.09 Mil/uL (ref 4.22–5.81)
RDW: 13.5 % (ref 11.5–15.5)
WBC: 9.8 10*3/uL (ref 4.0–10.5)

## 2016-10-09 LAB — HEPATIC FUNCTION PANEL
ALBUMIN: 4.8 g/dL (ref 3.5–5.2)
ALK PHOS: 59 U/L (ref 39–117)
ALT: 16 U/L (ref 0–53)
AST: 14 U/L (ref 0–37)
Bilirubin, Direct: 0.1 mg/dL (ref 0.0–0.3)
TOTAL PROTEIN: 7.7 g/dL (ref 6.0–8.3)
Total Bilirubin: 0.3 mg/dL (ref 0.2–1.2)

## 2016-10-09 LAB — URINALYSIS, ROUTINE W REFLEX MICROSCOPIC
Bilirubin Urine: NEGATIVE
Hgb urine dipstick: NEGATIVE
Ketones, ur: NEGATIVE
Leukocytes, UA: NEGATIVE
Nitrite: NEGATIVE
RBC / HPF: NONE SEEN (ref 0–?)
Specific Gravity, Urine: 1.01 (ref 1.000–1.030)
Total Protein, Urine: NEGATIVE
Urine Glucose: NEGATIVE
Urobilinogen, UA: 0.2 (ref 0.0–1.0)
WBC, UA: NONE SEEN (ref 0–?)
pH: 6 (ref 5.0–8.0)

## 2016-10-09 LAB — BASIC METABOLIC PANEL
BUN: 18 mg/dL (ref 6–23)
CALCIUM: 9.6 mg/dL (ref 8.4–10.5)
CO2: 32 meq/L (ref 19–32)
Chloride: 101 mEq/L (ref 96–112)
Creatinine, Ser: 0.83 mg/dL (ref 0.40–1.50)
GFR: 104.93 mL/min (ref >60.00)
GLUCOSE: 100 mg/dL — AB (ref 70–99)
Potassium: 5 mEq/L (ref 3.5–5.1)
SODIUM: 139 meq/L (ref 135–145)

## 2016-10-09 LAB — HEMOGLOBIN A1C: Hgb A1c MFr Bld: 6.1 % (ref 4.6–6.5)

## 2016-10-09 MED ORDER — CETIRIZINE HCL 10 MG PO TABS: 10.0000 mg | ORAL_TABLET | Freq: Every day | ORAL | 11 refills | Status: DC

## 2016-10-09 MED ORDER — ASPIRIN EC 81 MG PO TBEC: 81.0000 mg | DELAYED_RELEASE_TABLET | Freq: Every day | ORAL | 11 refills | Status: DC

## 2016-10-09 MED ORDER — PRAVASTATIN SODIUM 40 MG PO TABS: ORAL_TABLET | ORAL | 3 refills | Status: DC

## 2016-10-09 MED ORDER — TRIAMCINOLONE ACETONIDE 55 MCG/ACT NA AERO: 2.0000 | INHALATION_SPRAY | Freq: Every day | NASAL | 12 refills | Status: DC

## 2016-10-09 MED ORDER — LISINOPRIL 10 MG PO TABS: ORAL_TABLET | ORAL | 3 refills | Status: DC

## 2016-10-09 MED ORDER — PANTOPRAZOLE SODIUM 40 MG PO TBEC
40.0000 mg | DELAYED_RELEASE_TABLET | Freq: Every day | ORAL | 3 refills | Status: DC
Start: 2016-10-09 — End: 2017-09-17

## 2016-10-09 NOTE — Progress Notes (Signed)
Subjective:    Patient ID: Roger Long, male    DOB: 1966/11/22, 50 y.o.   MRN: [id number]  HPI  Here for wellness and f/u;  Overall doing ok;  Pt denies Chest pain, worsening SOB, DOE, wheezing, orthopnea, PND, worsening LE edema, palpitations, dizziness or syncope.  Pt denies neurological change such as new headache, facial or extremity weakness.  Pt denies polydipsia, polyuria, or low sugar symptoms. Pt states overall good compliance with treatment and medications, good tolerability, and has been trying to follow appropriate diet.  Pt denies worsening depressive symptoms, suicidal ideation or panic. No fever, night sweats, wt loss, loss of appetite, or other constitutional symptoms.  Pt states good ability with ADL's, has low fall risk, home safety reviewed and adequate, no other significant changes in hearing or vision, and occasionally active with exercis, goes to gym several times per wk.  C/o right > left bilat plnatar pain, burning, mild to mod, constant but worse to first walk in the AM, or after sitting for some time, nothing else makes better or worse.   Also, Does have several wks ongoing nasal allergy symptoms with clearish congestion, itch and sneezing, without fever, pain, ST, cough, swelling or wheezing. Past Medical History:  Diagnosis Date  . ALLERGIC RHINITIS 07/19/2009  . BACK PAIN 07/19/2009  . GERD 08/06/2010  . Headache(784.0) 08/06/2010  . HYPERLIPIDEMIA 08/06/2010  . Hypertension   . Impaired glucose tolerance 09/26/2011  . PLANTAR FASCIITIS 07/19/2009   No past surgical history on file.  reports that he has quit smoking. He does not have any smokeless tobacco history on file. He reports that he drinks alcohol. He reports that he does not use drugs. family history includes Hypertension in his mother; Stroke in his mother. No Known Allergies Current Outpatient Prescriptions on File Prior to Visit  Medication Sig Dispense Refill  . chlorhexidine (PERIDEX) 0.12 % solution Use as  directed 15 mLs in the mouth or throat 2 (two) times daily. 120 mL 0  . ipratropium (ATROVENT) 0.03 % nasal spray INHALE 2 SPRAYS INTO THE NOSE TWICE DAILY 30 mL 0   No current facility-administered medications on file prior to visit.    Review of Systems  VS noted,  Constitutional: Pt is oriented to person, place, and time. Appears well-developed and well-nourished, in no significant distress Head: Normocephalic and atraumatic  Eyes: Conjunctivae and EOM are normal. Pupils are equal, round, and reactive to light Right Ear: External ear normal.  Left Ear: External ear normal Nose: Nose normal.  Mouth/Throat: Oropharynx is clear and moist  Neck: Normal range of motion. Neck supple. No JVD present. No tracheal deviation present or significant neck LA or mass Cardiovascular: Normal rate, regular rhythm, normal heart sounds and intact distal pulses.   Pulmonary/Chest: Effort normal and breath sounds without rales or wheezing  Abdominal: Soft. Bowel sounds are normal. NT. No HSM  Musculoskeletal: Normal range of motion. Exhibits no edema Lymphadenopathy: Has no cervical adenopathy.  Neurological: Pt is alert and oriented to person, place, and time. Pt has normal reflexes. No cranial nerve deficit. Motor grossly intact Skin: Skin is warm and dry. No rash noted or new ulcers Psychiatric:  Has normal mood and affect. Behavior is normal.  All other symptoms neg per pt    Objective:   Physical Exam BP 132/72   Pulse 74   Temp 98.1 F (36.7 C) (Oral)   Resp 20   Wt 135 lb 8 oz (61.5 kg)   SpO2 98%  BMI 24.39 kg/m  VS noted,  Constitutional: Pt is oriented to person, place, and time. Appears well-developed and well-nourished, in no significant distress Head: Normocephalic and atraumatic  Eyes: Conjunctivae and EOM are normal. Pupils are equal, round, and reactive to light Right Ear: External ear normal.  Left Ear: External ear normal Nose: Nose normal.  Bilat tm's with mild erythema.   Max sinus areas mild tender.  Pharynx with mild erythema, no exudate Mouth/Throat: Oropharynx is clear and moist  Neck: Normal range of motion. Neck supple. No JVD present. No tracheal deviation present or significant neck LA or mass Cardiovascular: Normal rate, regular rhythm, normal heart sounds and intact distal pulses.   Pulmonary/Chest: Effort normal and breath sounds without rales or wheezing  Abdominal: Soft. Bowel sounds are normal. NT. No HSM  Musculoskeletal: Normal range of motion. Exhibits no edema, does have bilat plantar heel tender without skin change or ulcers Lymphadenopathy: Has no cervical adenopathy.  Neurological: Pt is alert and oriented to person, place, and time. Pt has normal reflexes. No cranial nerve deficit. Motor grossly intact Skin: Skin is warm and dry. No rash noted or new ulcers Psychiatric:  Has normal mood and affect. Behavior is normal. No other significant exam abnormals    Assessment & Plan:

## 2016-10-09 NOTE — Patient Instructions (Addendum)
Please start the Aspirin 81 mg - 1 per day  OK to take the OTC Zyrtec 10 mg per day, and the OTC NasaCort AQ as directed  Please continue all other medications as before, and refills have been done if requested.  Please have the pharmacy call with any other refills you may need.  Please continue your efforts at being more active, low cholesterol diet, and weight control.  You are otherwise up to date with prevention measures today.  Please call for appointment with Dr Tamala Julian (sports medicine) in this office if the feet pain gets worse  Please keep your appointments with your specialists as you may have planned  Please go to the LAB in the Basement (turn left off the elevator) for the tests to be done today  You will be contacted by phone if any changes need to be made immediately.  Otherwise, you will receive a letter about your results with an explanation, but please check with MyChart first.  Please remember to sign up for MyChart if you have not done so, as this will be important to you in the future with finding out test results, communicating by private email, and scheduling acute appointments online when needed.  Please return in 1 year for your yearly visit, or sooner if needed, with Lab testing done 3-5 days before

## 2016-10-09 NOTE — Progress Notes (Signed)
Pre visit review using our clinic review tool, if applicable. No additional management support is needed unless otherwise documented below in the visit note. 

## 2016-10-10 NOTE — Assessment & Plan Note (Addendum)
Mild to mod, for zyrtec/nasacort asd,  to f/u any worsening symptoms or concerns  In addition to the time spent performing CPE, I spent an additional 15 minutes face to face,in which greater than 50% of this time was spent in counseling and coordination of care for patient's illness as documented.   

## 2016-10-10 NOTE — Assessment & Plan Note (Signed)
Mild, for advil prn, stretching, soft soled shoes, shoe inserts, and consider f/u with Dr Smith/sport med but declines for now

## 2016-10-10 NOTE — Assessment & Plan Note (Signed)

## 2016-10-10 NOTE — Assessment & Plan Note (Signed)
stable overall by history and exam, recent data reviewed with pt, and pt to continue medical treatment as before,  to f/u any worsening symptoms or concerns Lab Results  Component Value Date   LDLCALC 103 (H) 04/06/2014

## 2016-10-10 NOTE — Assessment & Plan Note (Signed)
stable overall by history and exam, recent data reviewed with pt, and pt to continue medical treatment as before,  to f/u any worsening symptoms or concerns Lab Results  Component Value Date   HGBA1C 5.9 04/06/2014

## 2016-10-12 LAB — TSH: TSH: 2.25 u[IU]/mL (ref 0.35–4.50)

## 2016-10-12 LAB — PSA: PSA: 0.46 ng/mL (ref 0.10–4.00)

## 2016-10-21 ENCOUNTER — Other Ambulatory Visit: Payer: Self-pay | Admitting: Internal Medicine

## 2017-04-17 ENCOUNTER — Ambulatory Visit (INDEPENDENT_AMBULATORY_CARE_PROVIDER_SITE_OTHER): Payer: BLUE CROSS/BLUE SHIELD | Admitting: Family Medicine

## 2017-04-17 ENCOUNTER — Encounter: Payer: Self-pay | Admitting: Family Medicine

## 2017-04-17 VITALS — BP 125/80 | HR 104 | Temp 98.0°F | Resp 16 | Ht 62.6 in | Wt 135.2 lb

## 2017-04-17 DIAGNOSIS — J4521 Mild intermittent asthma with (acute) exacerbation: Secondary | ICD-10-CM

## 2017-04-17 DIAGNOSIS — J3089 Other allergic rhinitis: Secondary | ICD-10-CM

## 2017-04-17 DIAGNOSIS — R0981 Nasal congestion: Secondary | ICD-10-CM

## 2017-04-17 MED ORDER — ALBUTEROL SULFATE HFA 108 (90 BASE) MCG/ACT IN AERS: 2.0000 | INHALATION_SPRAY | RESPIRATORY_TRACT | 1 refills | Status: DC | PRN

## 2017-04-17 MED ORDER — ALBUTEROL SULFATE (2.5 MG/3ML) 0.083% IN NEBU
2.5000 mg | INHALATION_SOLUTION | Freq: Once | RESPIRATORY_TRACT | Status: AC
  Administered 2017-04-17: 2.5 mg via RESPIRATORY_TRACT

## 2017-04-17 MED ORDER — METHYLPREDNISOLONE ACETATE 80 MG/ML IJ SUSP
80.0000 mg | Freq: Once | INTRAMUSCULAR | Status: AC
  Administered 2017-04-17: 80 mg via INTRAMUSCULAR

## 2017-04-17 MED ORDER — MONTELUKAST SODIUM 10 MG PO TABS: 10.0000 mg | ORAL_TABLET | Freq: Every day | ORAL | 3 refills | Status: DC

## 2017-04-17 MED ORDER — AZELASTINE HCL 0.15 % NA SOLN: 1.0000 | Freq: Two times a day (BID) | NASAL | 3 refills | Status: DC

## 2017-04-17 MED ORDER — LEVOCETIRIZINE DIHYDROCHLORIDE 5 MG PO TABS: 5.0000 mg | ORAL_TABLET | Freq: Every evening | ORAL | 5 refills | Status: DC

## 2017-04-17 MED ORDER — TRIAMCINOLONE ACETONIDE 55 MCG/ACT NA AERO: 2.0000 | INHALATION_SPRAY | Freq: Every day | NASAL | 12 refills | Status: DC

## 2017-04-17 MED ORDER — IPRATROPIUM BROMIDE 0.02 % IN SOLN: 0.5000 mg | Freq: Once | RESPIRATORY_TRACT | Status: DC

## 2017-04-17 NOTE — Progress Notes (Addendum)
Subjective:  By signing my name below, I, Roger Long, attest that this documentation has been prepared under the direction and in the presence of Delman Cheadle, MD. Electronically Signed: Moises Long, [place]. 04/17/2017 , 12:14 PM .  Patient was seen in Room 9 .   Patient ID: Roger Long, male    DOB: 10/03/1966, 51 y.o.   MRN: [id number] Chief Complaint  Patient presents with  . Seasonal Allergies   HPI Roger Long is a 51 y.o. male who presents to Primary Care at [facility] complaining of cough with seasonal allergies and sinus pain that started about 1-2 months ago when the pollen started. He also reports not being able to sleep due to burning sore throat. He was prescribed triamcinolone nasal spray last fall by Dr Cathlean Cower. He's also been taking OTC zyrtec, claritin-d and benadryl with only temporary relief for about 2~3 hours; last taken yesterday.  He also mentions having some shortness of breath with the coughing. He can hear himself breathe like he "has asthma." He denies any inhalers. He denies dental pain.   Past Medical History:  Diagnosis Date  . ALLERGIC RHINITIS 07/19/2009  . BACK PAIN 07/19/2009  . GERD 08/06/2010  . Headache(784.0) 08/06/2010  . HYPERLIPIDEMIA 08/06/2010  . Hypertension   . Impaired glucose tolerance 09/26/2011  . PLANTAR FASCIITIS 07/19/2009   Prior to Admission medications   Medication Sig Start Date End Date Taking? Authorizing Provider  aspirin EC 81 MG tablet Take 1 tablet (81 mg total) by mouth daily. 10/09/16   Biagio Borg, MD  cetirizine (ZYRTEC) 10 MG tablet Take 1 tablet (10 mg total) by mouth daily. 10/09/16 10/09/17  Biagio Borg, MD  chlorhexidine (PERIDEX) 0.12 % solution Use as directed 15 mLs in the mouth or throat 2 (two) times daily. 05/01/16   Robyn Haber, MD  ipratropium (ATROVENT) 0.03 % nasal spray INHALE 2 SPRAYS INTO THE NOSE TWICE DAILY 05/06/16   Robyn Haber, MD  lisinopril (PRINIVIL,ZESTRIL) 10 MG tablet TAKE 1 TABLET(10 MG) BY  MOUTH DAILY 10/09/16   Biagio Borg, MD  pantoprazole (PROTONIX) 40 MG tablet Take 1 tablet (40 mg total) by mouth daily. Overdue for yearly physical must see MD for refills 10/09/16   Biagio Borg, MD  pantoprazole (PROTONIX) 40 MG tablet TAKE 1 TABLET BY MOUTH DAILY, OVERDUE FOR YEARLY PHYSICAL MUST SEE MD FOR REFILLS 10/22/16   Biagio Borg, MD  pravastatin (PRAVACHOL) 40 MG tablet TAKE 1 TABLET(40 MG) BY MOUTH DAILY 10/09/16   Biagio Borg, MD  triamcinolone (NASACORT AQ) 55 MCG/ACT AERO nasal inhaler Place 2 sprays into the nose daily. 10/09/16   Biagio Borg, MD   No Known Allergies  Review of Systems  Constitutional: Negative for chills, fatigue, fever and unexpected weight change.  HENT: Positive for hearing loss, sinus pain and sore throat. Negative for dental problem.   Eyes: Negative for visual disturbance.  Respiratory: Positive for cough and shortness of breath. Negative for chest tightness and wheezing.   Cardiovascular: Negative for chest pain, palpitations and leg swelling.  Gastrointestinal: Negative for abdominal pain and Long in stool.  Allergic/Immunologic: Positive for environmental allergies.  Neurological: Negative for dizziness, light-headedness and headaches.       Objective:   Physical Exam  Constitutional: He is oriented to person, place, and time. He appears well-developed and well-nourished. No distress.  HENT:  Head: Normocephalic and atraumatic.  Right Ear: Tympanic membrane is erythematous. A middle ear effusion is  present.  Left Ear: Tympanic membrane normal.  Nose: Nose normal.  Mouth/Throat: Posterior oropharyngeal erythema present.  Postnasal drip  Eyes: EOM are normal. Pupils are equal, round, and reactive to light.  Neck: Neck supple. No thyromegaly present.  Cardiovascular: Regular rhythm and normal heart sounds.  Tachycardia present.   No murmur heard. Pulmonary/Chest: Effort normal. No respiratory distress.  Musculoskeletal: Normal  range of motion.  Lymphadenopathy:    He has no cervical adenopathy.  Neurological: He is alert and oriented to person, place, and time.  Skin: Skin is warm. He is diaphoretic.  Psychiatric: He has a normal mood and affect. His behavior is normal.  Nursing note and vitals reviewed.   BP 125/80   Pulse (!) 104   Temp 98 F (36.7 C) (Oral)   Resp 16   Ht 5' 2.6" (1.59 m)   Wt 135 lb 3.2 oz (61.3 kg)   SpO2 94%   BMI 24.26 kg/m     Pre-neb peak flow: 450, after unchanged.  Predicted peak flow 471 However, pt states he felt sig better after neb  Assessment & Plan:   1. Mild intermittent reactive airway disease with acute exacerbation   2. Non-seasonal allergic rhinitis, unspecified trigger   3. Sinus congestion   Pt repeatedly requested a shot for his sxs that he had received sev times prev here so will try Depo-Medrol 80mg  IM.  See below for new regimen for the chronic rhinitis. If sxs cont, consider referral to allergist for allergy testing/shots.  Orders Placed This Encounter  Procedures  . Care order/instruction:    Scheduling Instructions:     Peak Flow (IF NEB IS ORDERED PLEASE DO BEFORE AND AFTER NEB)  . Care order/instruction:    Scheduling Instructions:     Complete orders, AVS and go. - if he has a big improvement on the peak flow and/or feels a lot better after the neb let me know so I can send in an albuterol inhaler    Meds ordered this encounter  Medications  . albuterol (PROVENTIL) (2.5 MG/3ML) 0.083% nebulizer solution 2.5 mg  . DISCONTD: ipratropium (ATROVENT) nebulizer solution 0.5 mg  . methylPREDNISolone acetate (DEPO-MEDROL) injection 80 mg  . Azelastine HCl 0.15 % SOLN    Sig: Place 1 spray into the nose 2 (two) times daily.    Dispense:  30 mL    Refill:  3  . montelukast (SINGULAIR) 10 MG tablet    Sig: Take 1 tablet (10 mg total) by mouth at bedtime.    Dispense:  30 tablet    Refill:  3  . levocetirizine (XYZAL) 5 MG tablet    Sig: Take 1  tablet (5 mg total) by mouth every evening.    Dispense:  30 tablet    Refill:  5  . triamcinolone (NASACORT AQ) 55 MCG/ACT AERO nasal inhaler    Sig: Place 2 sprays into the nose daily.    Dispense:  1 Inhaler    Refill:  12  . albuterol (PROVENTIL HFA;VENTOLIN HFA) 108 (90 Base) MCG/ACT inhaler    Sig: Inhale 2 puffs into the lungs every 4 (four) hours as needed for wheezing or shortness of breath (cough, shortness of breath or wheezing.).    Dispense:  1 Inhaler    Refill:  1    I personally performed the services described in this documentation, which was scribed in my presence. The recorded information has been reviewed and considered, and addended by me as needed.  Delman Cheadle, M.D.  Primary Care at [facility] [address] [place], [place] [address] [phone] phone [phone] fax  04/17/17 12:55 PM

## 2017-04-17 NOTE — Patient Instructions (Addendum)
Use the azelastine nasal spray twice a day with the triamcinolone nasal spray over night. Take the levocetirizine and the montelukast every night. Use should not need any additional otc meds.    Make sure your heart rate is not >100 beats/minute by feeling the pulse at your wrist (thumb/palm side) or neck.   IF you received an x-ray today, you will receive an invoice from [facility]. Please contact [facility] at [phone] with questions or concerns regarding your invoice.   IF you received labwork today, you will receive an invoice from [facility]. Please contact [facility] at [phone] with questions or concerns regarding your invoice.   Our billing staff will not be able to assist you with questions regarding bills from these companies.  You will be contacted with the lab results as soon as they are available. The fastest way to get your results is to activate your My Chart account. Instructions are located on the last page of this paperwork. If you have not heard from [place] regarding the results in 2 weeks, please contact this office.     How to Take a Pulse Your pulse is the increase in pressure inside the blood vessels that carry blood from your heart to the rest of your body (arteries). Every time your heart beats, you can feel your pulse in an artery near the surface of your skin. You can easily feel your pulse in the artery in your wrist (radial artery) and in the artery in your neck (carotid artery). Taking your pulse can tell you how fast your heart is beating and whether it has a normal rhythm. You can also tell whether your heart is beating strongly or weakly. What you need to know about pulse rates Your pulse is the same as your heart rate. Both are measured in beats per minute (bpm). A normal resting heart rate varies depending on a person's age.  Infants under 1 year of age: Normal heart rate of 100-160 bpm.  Children 64-42 years of age: Normal heart rate of  90-150 bpm.  Children 49-3 years of age: Normal heart rate of 80-140 bpm.  Children 57-5 years of age: Normal heart rate of 70-120 bpm.  Everyone over 39 years of age: Normal heart rate of 60-100 bpm. There can be a lot of variation in your pulse. It can be different depending on the time of day or the amount of exercise that you get. It changes with your fitness level. Many things can change the speed and regularity of your pulse. These include:  Exercise.  Fever.  Stress.  Heart problems.  Poor circulation.  Medicines. How to take your pulse To take your pulse, all you need is a digital stopwatch or a clock or watch that has a second hand. The best time to measure your resting pulse is in the morning before you start moving around. Take it as soon as you wake up or after resting for about 10 minutes. There are no firm rules about how often to check your pulse. In general, it is a good idea to check your pulse at least once a month. Measuring your pulse is a good way to check your heart health. Checking your pulse before and after exercise can tell you if you are getting the right amount of exercise. This is called finding your target heart rate. Your target heart rate depends on your age, fitness, and health. Ask your health care provider what would be a safe target heart rate for  you during exercise. Radial Pulse  To check the pulse in your radial artery: 1. Turn one hand palm-up and relax your arm. 2. Place the first two fingers of your other hand gently over your wrist, just below the base of your thumb. 3. Place your fingertips just inside the bone that runs along the outside of your arm. 4. Slowly increase pressure until you feel a pulsing beneath your fingers. You may need to move your fingers slightly. 5. Do not press too hard. Too much pressure may cut off blood supply. 6. Count how many pulse beats you feel in 1 minute. Or, count how many pulse beats you feel in 30 seconds and  double that number. 7. Pay attention to the rhythm of the pulse. It should be steady and even. Carotid Pulse  To check the pulse in your carotid artery: 1. Place two fingers just to one side of your Adam's apple so that you feel a pulsing beneath your fingers. 2. Do not press too hard. Too much pressure may cut off blood supply and can make you dizzy. 3. Count how many pulse beats you feel in 1 minute. Or, count how many pulse beats you feel in 30 seconds and double that number. 4. Pay attention to the rhythm of the pulse. It should be steady and even. Contact a health care provider if:  Your pulse is too slow or too fast.  Your pulse is weak or hard to find.  You have skipped beats or extra beats.  Your pulse has an irregular rhythm.  You have an abnormal pulse along with dizziness, fatigue, or shortness of breath. This information is not intended to replace advice given to you by your health care provider. Make sure you discuss any questions you have with your health care provider. Document Released: 06/06/2003 Document Revised: 06/19/2016 Document Reviewed: 05/05/2016 Elsevier Interactive Patient Education  2017 [employer] Inc.  Nasal Allergies Nasal allergies are a reaction to allergens in the air. Allergens are particles in the air that cause your body to have an allergic reaction. Nasal allergies are not passed from person to person (are not contagious). They cannot be cured, but they can be controlled. What are the causes? Seasonal nasal allergies (hay fever) are caused by pollen allergens that come from grasses, trees, and weeds. Year-round nasal allergies (perennial allergic rhinitis) are caused by allergens such as house dust mites, pet dander, and mold spores. What increases the risk? The following factors may make you more likely to develop this condition:  Having certain health conditions. These include:  Other types of allergies, such as food  allergies.  Asthma.  Eczema.  Having a close relative who has allergies or asthma.  Exposure to house dust, pollen, dander, or other allergens at home or at work.  Exposure to air pollution or secondhand smoke when you were a child. What are the signs or symptoms? Symptoms of this condition include:  Sneezing.  Runny nose or stuffy nose (congestion).  Watery (tearing) eyes.  Itchy eyes, nose, mouth, throat, skin, or other area.  Sore throat.  Headache.  Decreased sense of smell or taste.  Fatigue. This may occur if you have trouble sleeping due to allergies.  Swollen eyelids. How is this diagnosed? This condition is diagnosed with a medical history and physical exam. Allergy testing may be done to determine exactly what triggers your nasal allergies. How is this treated? There is no cure for nasal allergies. Treatment focuses on controlling your symptoms, and  it may include:  Medicines that block allergy symptoms. These may include allergy shots, nasal sprays, and oral antihistamines.  Avoiding the allergen. Follow these instructions at home:  Avoid the allergen that is causing your symptoms, if possible.  Keep windows closed. If possible, use air conditioning when pollen counts are high.  Do not use fans in your home.  Do not hang clothes outside to dry.  Wear sunglasses to keep pollen out of your eyes.  Wash your hands right away after you touch household pets.  Take over-the-counter and prescription medicines only as told by your health care provider.  Keep all follow-up visits as told by your health care provider. This is important. Contact a health care provider if:  You have a fever.  You develop a cough that does not go away (is persistent).  You start to wheeze.  Your symptoms do not improve with treatment.  You have thick nasal discharge.  You start to have nosebleeds. Get help right away if:  Your tongue or your lips are swollen.  You  have trouble breathing.  You feel light-headed or you feel like you are going to faint.  You have cold sweats. This information is not intended to replace advice given to you by your health care provider. Make sure you discuss any questions you have with your health care provider. Document Released: 11/30/2005 Document Revised: 05/04/2016 Document Reviewed: 06/12/2015 Elsevier Interactive Patient Education  2017 [employer].

## 2017-06-18 ENCOUNTER — Ambulatory Visit (INDEPENDENT_AMBULATORY_CARE_PROVIDER_SITE_OTHER): Payer: BLUE CROSS/BLUE SHIELD | Admitting: Family Medicine

## 2017-06-18 ENCOUNTER — Encounter: Payer: Self-pay | Admitting: Family Medicine

## 2017-06-18 VITALS — BP 138/88 | HR 84 | Temp 98.4°F | Resp 16 | Ht 62.6 in | Wt 135.2 lb

## 2017-06-18 DIAGNOSIS — M7712 Lateral epicondylitis, left elbow: Secondary | ICD-10-CM | POA: Diagnosis not present

## 2017-06-18 DIAGNOSIS — M7711 Lateral epicondylitis, right elbow: Secondary | ICD-10-CM

## 2017-06-18 DIAGNOSIS — L237 Allergic contact dermatitis due to plants, except food: Secondary | ICD-10-CM | POA: Diagnosis not present

## 2017-06-18 MED ORDER — TRIAMCINOLONE ACETONIDE 0.1 % EX CREA: 1.0000 "application " | TOPICAL_CREAM | Freq: Four times a day (QID) | CUTANEOUS | 0 refills | Status: DC

## 2017-06-18 MED ORDER — MELOXICAM 15 MG PO TABS: 15.0000 mg | ORAL_TABLET | Freq: Every day | ORAL | 0 refills | Status: DC

## 2017-06-18 NOTE — Progress Notes (Signed)
   Subjective:    Patient ID: Roger Long, male    DOB: [birth year], 51 y.o.   MRN: [id number] Chief Complaint  Patient presents with  . Pain    both elbows x 1 month.  . Poison Ivy    both arms, onset: tuesday, itchy    HPI  Started on left elbow, now both Is righthanded   Numbness and tingling of left hand   Review of Systems     Objective:   Physical Exam  Negative Tinel's, Negative Phalen's.   BP 138/88 (BP Location: Right Arm, Patient Position: Sitting, Cuff Size: Normal)   Pulse 84   Temp 98.4 F (36.9 C) (Oral)   Resp 16   Ht 5' 2.6" (1.59 m)   Wt 135 lb 3.2 oz (61.3 kg)   SpO2 95%   BMI 24.26 kg/m      Assessment & Plan:   1. Lateral epicondylitis of both elbows   2. Poison ivy dermatitis     Meds ordered this encounter  Medications  . meloxicam (MOBIC) 15 MG tablet    Sig: Take 1 tablet (15 mg total) by mouth daily.    Dispense:  30 tablet    Refill:  0  . triamcinolone cream (KENALOG) 0.1 %    Sig: Apply 1 application topically 4 (four) times daily.    Dispense:  80 g    Refill:  0    Delman Cheadle, M.D.  Primary Care at [facility] [address] [place], [place] [address] [phone] phone [phone] fax  06/21/17 8:17 AM

## 2017-06-18 NOTE — Patient Instructions (Signed)
Apply the triamcinolone ointment to the poison ivy 4 times a day until the rash is gone. It can take 2 weeks for this to go away.  Wear the bands all the time. Take them off at night before bed and apply first thing in the morning.  After a week or two, start the exercises below. If you are still having pain in a month or the numbness/tingling gets worse, please come back sooner.  Ch?ng ?au khu?u tay c?a ng??i ch?i tennis (Tennis Elbow) Ch?ng ?au khu?u tay c?a ng??i ch?i tennis (vim m?m trn l?i c?u ngoi x??ng cnh tay ) l vim gn ngoi c?a c?ng tay g?n khu?u tay. Gn n?i c? v?i x??ng c?a qu v?. Gn ngoi c?a c?ng tay ???c dng ?? du?i c? tay v chng bm vo ph?n bn ngoi c?a khu?u tay qu v?. Ch?ng ?au khu?u tay c?a ng??i ch?i tennis th??ng th?y ? nh?ng ng??i ch?i tennis, nh?ng b?t k? ai c?ng c th? b? b?nh ny do lin t?c du?i c? tay ho?c xoay c?ng tay. NGUYN NHN Tnh tr?ng ny do vi?c lin t?c du?i c? tay v s? d?ng bn tay c?a qu v? gy ra. N c th? do ch?i th? thao ho?c lm nh?ng cng vi?c ?i h?i lin t?c c? ??ng c?ng tay. Ch?ng ?au khu?u tay c?a ng??i ch?i tennis c?ng c th? do ch?n th??ng gy ra. CC Y?U T? NGUY C? Qu v? c nguy c? b? ch?ng ?au khu?u tay c?a ng??i ch?i tennis cao h?n n?u qu v? ch?i tennis ho?c ch?i m?t mn th? thao c dng v?t khc. Qu v? c?ng c th? c nguy c? cao h?n n?u th??ng xuyn s? d?ng bn tay ?? lm vi?c. Tnh tr?ng ny c?ng hay x?y ra h?n ?:  Nh?c s?.  Th? m?c, h?a s? v th? ???ng ?ng.  Ng??i n?u ?n.  Nhn vin thu ngn.  Nh?ng ng??i lm vi?c trong nh my.  Cng nhn xy d?ng.  Ng??i bn th?t.  Ng??i s? d?ng my vi tnh. TRI?U CH?NG Nh?ng tri?u ch?ng c?a tnh tr?ng ny bao g?m:  ?au v nh?y c?m ?au ? c?ng tay v ph?n ngoi c?a khu?u tay. Qu v? c th? ch? c?m th?y ?au khi c? ??ng cnh tay, ho?c qu v? c th? c?m th?y ?au ngay c? khi qu v? khng c? ??ng cnh tay.  M?t c?m gic nng rt ch?y t? khu?u tay ??n cnh tay.  Tay n?m l?i  y?u. CH?N ?ON B?nh ny c th? ???c ch?n ?on d?a vo khai thc b?nh s? v khm th?c th?Sander Nephew v? c?ng c th? ph?i lm cc ki?m tra khc, bao g?m:  Ch?p X quang.  Ch?p MRI. ?I?U TR? Chuyn gia ch?m [place] s?c kh?e s? khuy?n ngh? ?i?u ch?nh l?i s?ng, ch?ng h?n nh? ngh? ng?i v ch??m ? l?nh vo cnh tay. ?i?u tr? c?ng c th? bao g?m:  Thu?c ?? gi?m vim. Thu?c ny c th? g?m thu?c tim cortisone n?u c?n ?au c?a qu v? ti?p t?c.  V?t l tr? li?u. Li?u php ny c th? bao g?m xoa bp ho?c t?p th? d?c.  Dy ?eo khu?u tay. Cu?i cng c th? s? c?n ph?u thu?t n?u c?n ?au c?a qu v? khng h?t sau khi ???c ?i?u tr?Marland Kitchen H??NG D?N CH?M [place] T?I NH Ho?t ??ng  Cho khu?u tay v c? tay qu v? ngh? ng?i theo ch? d?n c?a chuyn gia ch?m [place] s?c kh?e. C? g?ng trnh cc ho?t ??ng c th?  lm pht sinh v?n ?? cho ??n khi chuyn gia ch?m [place] s?c kh?e ni qu v? l?i c th? th?c hi?n cc ho?t ??ng ?.  N?u m?t chuyn gia v?t l tr? li?u h??ng d?n qu v? t?p th? d?c, hy t?p t?t c? cc bi theo h??ng d?n.  N?u qu v? nng m?t v?t, hy nng v?t ? v?i lng bn tay h??ng ln trn. Lm nh? v?y s? gi?m c?ng th?ng cho khu?u tay qu v?. L?i s?ng  N?u ch?ng ?au khu?u tay c?a ng??i ch?i tennis do th? thao gy ra, hy ki?m tra trang thi?t b? c?a qu v? v b?o ??m r?ng: ? Qu v? s? d?ng n ?ng cch. ? N ph h?p nh?t v?i qu v?.  N?u ch?ng ?au khu?u tay c?a ng??i ch?i tennis do cng vi?c gy ra, hy gi?i lao th??ng xuyn n?u c th?. Hy ni chuy?n v?i qu?n l c?a qu v? xem lm th? no ?? c th? th?c hi?n nhi?m v? t?t nh?t theo cch an ton. ? N?u ch?ng ?au khu?u tay c?a ng??i ch?i tennis do vi?c s? d?ng my vi tnh gy ra, hy ni chuy?n v?i qu?n l c?a qu v? v? b?t k? thay ??i no c th? c ??i v?i mi tr??ng lm vi?c c?a qu v?. H??ng d?n chung  N?u ???c ch? d?n, hy ch??m ? l?nh vo vng b? ?au: ? Cho ? l?nh vo ti nh?a. ? ?? kh?n t?m ? [employer] [place] v ti ch??m. ? Ch??m ? l?nh trong kho?ng 20 pht, 2-3 l?n m?i  ngy.  Ch? s? d?ng thu?c theo ch? d?n c?a chuyn gia ch?m [place] s?c kh?e.  N?u ???c cho dng dy ?eo, hy ?eo theo ch? d?n c?a chuyn gia ch?m [place] s?c kh?e.  Tun th? t?t c? cc cu?c h?n khm l?i theo ch? d?n c?a chuyn gia ch?m [place] s?c kh?e. ?i?u ny c vai tr quan tr?ng. ?I KHM N?U:  C?n ?au c?a qu v? khng ?? h?n sau khi ???c ?i?u tr?.  C?n ?au tr? nn tr?m tr?ng h?n.  Qu v? b? t b ho?c y?u ? c?ng tay, bn tay, ho?c ngn tay. Thng tin ny khng nh?m m?c ?ch thay th? cho l?i khuyn m chuyn gia ch?m [place] s?c kh?e ni v?i qu v?. Hy b?o ??m qu v? ph?i th?o lu?n b?t k? v?n ?? g m qu v? c v?i chuyn gia ch?m [place] s?c kh?e c?a qu v?. Document Released: 11/30/2005 Document Revised: 04/16/2015 Document Reviewed: 11/26/2014 Elsevier Interactive Patient Education  2018 [place] Ask your health care provider which exercises are safe for you. Do exercises exactly as told by your health care provider and adjust them as directed. It is normal to feel mild stretching, pulling, tightness, or discomfort as you do these exercises, but you should stop right away if you feel sudden pain or your pain gets worse. Do not begin these exercises until told by your health care provider. Stretching and range of motion exercises These exercises warm up your muscles and joints and improve the movement and flexibility of your elbow. These exercises also help to relieve pain, numbness, and tingling. Exercise A: Wrist extensor stretch 1. Extend your left / right elbow with your fingers pointing down. 2. Gently pull the palm of your left / right hand toward you until you feel a gentle stretch on the top of your forearm. 3. To increase the stretch, push your left / right hand toward the  outer edge or pinkie side of your forearm. 4. Hold this position for __________ seconds. Repeat __________ times. Complete this exercise __________ times a day. If directed by your health care  provider, repeat this stretch except do it with a bent elbow this time. Exercise B: Wrist flexor stretch  1. Extend your left / right elbow and turn your palm upward. 2. Gently pull your left / right palm and fingertips back so your wrist extends and your fingers point more toward the ground. 3. You should feel a gentle stretch on the inside of your forearm. 4. Hold this position for __________ seconds. Repeat __________ times. Complete this exercise __________ times a day. If directed by your health care provider, repeat this stretch except do it with a bent elbow this time. Strengthening exercises These exercises build strength and endurance in your elbow. Endurance is the ability to use your muscles for a long time, even after they get tired. Exercise C: Wrist extensors  1. Sit with your left / right forearm palm-down and fully supported on a table or countertop. Your elbow should be resting below the height of your shoulder. 2. Let your left / right wrist extend over the edge of the surface. 3. Loosely hold a __________ weight or a piece of rubber exercise band or tubing in your left / right hand. Slowly curl your left / right hand up toward your forearm. If you are using band or tubing, hold the band or tubing in place with your other hand to provide resistance. 4. Hold this position for __________ seconds. 5. Slowly return to the starting position. Repeat __________ times. Complete this exercise __________ times a day. Exercise D: Radial deviators  1. Stand with a __________ weight in your left / righthand. Or, sit while holding a rubber exercise band or tubing with your other arm supported on a table or countertop. Position your hand so your thumb is on top. 2. Raise your hand upward in front of you so your thumb travels toward your forearm, or pull up on the rubber tubing. 3. Hold this position for __________ seconds. 4. Slowly return to the starting position. Repeat __________  times. Complete this exercise __________ times a day. Exercise E: Eccentric wrist extensors 1. Sit with your left / right forearm palm-down and fully supported on a table or countertop. Your elbow should be resting below the height of your shoulder. 2. If told by your health care provider, hold a __________ weight in your hand. 3. Let your left / right wrist extend over the edge of the surface. 4. Use your other hand to lift up your left / right hand toward your forearm. Keep your forearm on the table. 5. Using only the muscles in your left / right hand, slowly lower your hand back down to the starting position. Repeat __________ times. Complete this exercise __________ times a day. This information is not intended to replace advice given to you by your health care provider. Make sure you discuss any questions you have with your health care provider. Document Released: 11/30/2005 Document Revised: 08/05/2016 Document Reviewed: 08/29/2015 Elsevier Interactive Patient Education  [employer].

## 2017-09-17 ENCOUNTER — Encounter: Payer: Self-pay | Admitting: Internal Medicine

## 2017-09-17 ENCOUNTER — Other Ambulatory Visit (INDEPENDENT_AMBULATORY_CARE_PROVIDER_SITE_OTHER): Payer: BLUE CROSS/BLUE SHIELD

## 2017-09-17 ENCOUNTER — Ambulatory Visit (INDEPENDENT_AMBULATORY_CARE_PROVIDER_SITE_OTHER): Payer: BLUE CROSS/BLUE SHIELD | Admitting: Internal Medicine

## 2017-09-17 VITALS — BP 110/78 | HR 87 | Temp 98.5°F | Ht 66.25 in | Wt 137.0 lb

## 2017-09-17 DIAGNOSIS — R7302 Impaired glucose tolerance (oral): Secondary | ICD-10-CM

## 2017-09-17 DIAGNOSIS — Z114 Encounter for screening for human immunodeficiency virus [HIV]: Secondary | ICD-10-CM

## 2017-09-17 DIAGNOSIS — Z0000 Encounter for general adult medical examination without abnormal findings: Secondary | ICD-10-CM | POA: Diagnosis not present

## 2017-09-17 LAB — BASIC METABOLIC PANEL
BUN: 13 mg/dL (ref 6–23)
CALCIUM: 8.8 mg/dL (ref 8.4–10.5)
CHLORIDE: 101 meq/L (ref 96–112)
CO2: 30 meq/L (ref 19–32)
Creatinine, Ser: 0.75 mg/dL (ref 0.40–1.50)
GFR: 117.49 mL/min (ref >60.00)
Glucose, Bld: 111 mg/dL — ABNORMAL HIGH (ref 70–99)
POTASSIUM: 4.1 meq/L (ref 3.5–5.1)
SODIUM: 140 meq/L (ref 135–145)

## 2017-09-17 LAB — LIPID PANEL
CHOLESTEROL: 219 mg/dL — AB (ref 0–200)
HDL: 43.2 mg/dL (ref >39.00)
LDL Cholesterol: 137 mg/dL — ABNORMAL HIGH (ref 0–99)
NONHDL: 175.41
Total CHOL/HDL Ratio: 5
Triglycerides: 190 mg/dL — ABNORMAL HIGH (ref 0.0–149.0)
VLDL: 38 mg/dL (ref 0.0–40.0)

## 2017-09-17 LAB — URINALYSIS, ROUTINE W REFLEX MICROSCOPIC
BILIRUBIN URINE: NEGATIVE
HGB URINE DIPSTICK: NEGATIVE
Ketones, ur: NEGATIVE
Leukocytes, UA: NEGATIVE
Nitrite: NEGATIVE
PH: 7 (ref 5.0–8.0)
RBC / HPF: NONE SEEN (ref 0–?)
Specific Gravity, Urine: 1.02 (ref 1.000–1.030)
TOTAL PROTEIN, URINE-UPE24: NEGATIVE
UROBILINOGEN UA: 0.2 (ref 0.0–1.0)
Urine Glucose: NEGATIVE
WBC, UA: NONE SEEN (ref 0–?)

## 2017-09-17 LAB — HEPATIC FUNCTION PANEL
ALK PHOS: 61 U/L (ref 39–117)
ALT: 15 U/L (ref 0–53)
AST: 13 U/L (ref 0–37)
Albumin: 4.4 g/dL (ref 3.5–5.2)
BILIRUBIN DIRECT: 0.1 mg/dL (ref 0.0–0.3)
TOTAL PROTEIN: 7.5 g/dL (ref 6.0–8.3)
Total Bilirubin: 0.3 mg/dL (ref 0.2–1.2)

## 2017-09-17 LAB — CBC WITH DIFFERENTIAL/PLATELET
BASOS ABS: 0 10*3/uL (ref 0.0–0.1)
Basophils Relative: 0.4 % (ref 0.0–3.0)
Eosinophils Absolute: 0.3 10*3/uL (ref 0.0–0.7)
Eosinophils Relative: 3.8 % (ref 0.0–5.0)
HCT: 44.4 % (ref 39.0–52.0)
Hemoglobin: 14.5 g/dL (ref 13.0–17.0)
LYMPHS ABS: 2.5 10*3/uL (ref 0.7–4.0)
Lymphocytes Relative: 30.5 % (ref 12.0–46.0)
MCHC: 32.5 g/dL (ref 30.0–36.0)
MCV: 92.7 fl (ref 78.0–100.0)
MONO ABS: 0.6 10*3/uL (ref 0.1–1.0)
MONOS PCT: 7.9 % (ref 3.0–12.0)
NEUTROS ABS: 4.7 10*3/uL (ref 1.4–7.7)
NEUTROS PCT: 57.4 % (ref 43.0–77.0)
PLATELETS: 351 10*3/uL (ref 150.0–400.0)
RBC: 4.79 Mil/uL (ref 4.22–5.81)
RDW: 13.4 % (ref 11.5–15.5)
WBC: 8.2 10*3/uL (ref 4.0–10.5)

## 2017-09-17 LAB — TSH: TSH: 1.68 u[IU]/mL (ref 0.35–4.50)

## 2017-09-17 LAB — PSA: PSA: 0.52 ng/mL (ref 0.10–4.00)

## 2017-09-17 MED ORDER — CETIRIZINE HCL 10 MG PO TABS
10.0000 mg | ORAL_TABLET | Freq: Every day | ORAL | 3 refills | Status: DC
Start: 2017-09-17 — End: 2020-08-21

## 2017-09-17 MED ORDER — PRAVASTATIN SODIUM 40 MG PO TABS: ORAL_TABLET | ORAL | 3 refills | Status: DC

## 2017-09-17 MED ORDER — LISINOPRIL 10 MG PO TABS: ORAL_TABLET | ORAL | 3 refills | Status: DC

## 2017-09-17 MED ORDER — PANTOPRAZOLE SODIUM 40 MG PO TBEC: 40.0000 mg | DELAYED_RELEASE_TABLET | Freq: Every day | ORAL | 3 refills | Status: DC

## 2017-09-17 NOTE — Progress Notes (Signed)
Subjective:    Patient ID: Roger Long, male    DOB: August 23, 1966, 51 y.o.   MRN: [id number]  HPI  Here for wellness and f/u;  Overall doing ok;  Pt denies Chest pain, worsening SOB, DOE, wheezing, orthopnea, PND, worsening LE edema, palpitations, dizziness or syncope.  Pt denies neurological change such as new headache, facial or extremity weakness.  Pt denies polydipsia, polyuria, or low sugar symptoms. Pt states overall good compliance with treatment and medications, good tolerability, and has been trying to follow appropriate diet.  Pt denies worsening depressive symptoms, suicidal ideation or panic. No fever, night sweats, wt loss, loss of appetite, or other constitutional symptoms.  Pt states good ability with ADL's, has low fall risk, home safety reviewed and adequate, no other significant changes in hearing or vision, and only occasionally active with exercise.  Does have mild right lateral epicondylar tender pain, better with armband use.  Is not interested in statin at this time, prefers to cont to follow a lower chol diet. Past Medical History:  Diagnosis Date  . ALLERGIC RHINITIS 07/19/2009  . BACK PAIN 07/19/2009  . GERD 08/06/2010  . Headache(784.0) 08/06/2010  . HYPERLIPIDEMIA 08/06/2010  . Hypertension   . Impaired glucose tolerance 09/26/2011  . PLANTAR FASCIITIS 07/19/2009   No past surgical history on file.  reports that he has quit smoking. He has never used smokeless tobacco. He reports that he drinks alcohol. He reports that he does not use drugs. family history includes Hypertension in his mother; Stroke in his mother. No Known Allergies Current Outpatient Prescriptions on File Prior to Visit  Medication Sig Dispense Refill  . aspirin EC 81 MG tablet Take 1 tablet (81 mg total) by mouth daily. 90 tablet 11  . levocetirizine (XYZAL) 5 MG tablet Take 1 tablet (5 mg total) by mouth every evening. 30 tablet 5  . triamcinolone cream (KENALOG) 0.1 % Apply 1 application topically 4  (four) times daily. 80 g 0   No current facility-administered medications on file prior to visit.    Review of Systems  Constitutional: Negative for other unusual diaphoresis, sweats, appetite or weight changes HENT: Negative for other worsening hearing loss, ear pain, facial swelling, mouth sores or neck stiffness.   Eyes: Negative for other worsening pain, redness or other visual disturbance.  Respiratory: Negative for other stridor or swelling Cardiovascular: Negative for other palpitations or other chest pain  Gastrointestinal: Negative for worsening diarrhea or loose stools, blood in stool, distention or other pain Genitourinary: Negative for hematuria, flank pain or other change in urine volume.  Musculoskeletal: Negative for myalgias or other joint swelling.  Skin: Negative for other color change, or other wound or worsening drainage.  Neurological: Negative for other syncope or numbness. Hematological: Negative for other adenopathy or swelling Psychiatric/Behavioral: Negative for hallucinations, other worsening agitation, SI, self-injury, or new decreased concentration All other system neg per pt    Objective:   Physical Exam BP 110/78   Pulse 87   Temp 98.5 F (36.9 C) (Oral)   Ht 5' 6.25" (1.683 m)   Wt 137 lb (62.1 kg)   SpO2 98%   BMI 21.95 kg/m  VS noted,  Constitutional: Pt is oriented to person, place, and time. Appears well-developed and well-nourished, in no significant distress and comfortable Head: Normocephalic and atraumatic  Eyes: Conjunctivae and EOM are normal. Pupils are equal, round, and reactive to light Right Ear: External ear normal without discharge Left Ear: External ear normal without discharge Nose:  Nose without discharge or deformity Mouth/Throat: Oropharynx is without other ulcerations and moist  Neck: Normal range of motion. Neck supple. No JVD present. No tracheal deviation present or significant neck LA or mass Cardiovascular: Normal rate,  regular rhythm, normal heart sounds and intact distal pulses.   Pulmonary/Chest: WOB normal and breath sounds without rales or wheezing  Abdominal: Soft. Bowel sounds are normal. NT. No HSM  Musculoskeletal: Normal range of motion. Exhibits no edema, has mild right lateral epicondylar tender without swelling Lymphadenopathy: Has no other cervical adenopathy.  Neurological: Pt is alert and oriented to person, place, and time. Pt has normal reflexes. No cranial nerve deficit. Motor grossly intact, Gait intact Skin: Skin is warm and dry. No rash noted or new ulcerations Psychiatric:  Has normal mood and affect. Behavior is normal without agitation No other exam findings  Lab Results  Component Value Date   WBC 8.2 09/17/2017   HGB 14.5 09/17/2017   HCT 44.4 09/17/2017   PLT 351.0 09/17/2017   GLUCOSE 111 (H) 09/17/2017   CHOL 219 (H) 09/17/2017   TRIG 190.0 (H) 09/17/2017   HDL 43.20 09/17/2017   LDLDIRECT 142.0 08/29/2015   LDLCALC 137 (H) 09/17/2017   ALT 15 09/17/2017   AST 13 09/17/2017   NA 140 09/17/2017   K 4.1 09/17/2017   CL 101 09/17/2017   CREATININE 0.75 09/17/2017   BUN 13 09/17/2017   CO2 30 09/17/2017   TSH 1.68 09/17/2017   PSA 0.52 09/17/2017   HGBA1C 6.1 10/09/2016        Assessment & Plan:

## 2017-09-17 NOTE — Patient Instructions (Signed)
Please continue all other medications as before, and refills have been done if requested.  Please have the pharmacy call with any other refills you may need.  Please continue your efforts at being more active, low cholesterol diet, and weight control.  You are otherwise up to date with prevention measures today.  Please keep your appointments with your specialists as you may have planned  Please call if your right elbow is worse, for a Sports Medicine referral  Please go to the LAB in the Basement (turn left off the elevator) for the tests to be done today  You will be contacted by phone if any changes need to be made immediately.  Otherwise, you will receive a letter about your results with an explanation, but please check with MyChart first.  Please remember to sign up for MyChart if you have not done so, as this will be important to you in the future with finding out test results, communicating by private email, and scheduling acute appointments online when needed.  Please return in 1 year for your yearly visit, or sooner if needed, with Lab testing done 3-5 days before

## 2017-09-18 LAB — HIV ANTIBODY (ROUTINE TESTING W REFLEX): HIV 1&2 Ab, 4th Generation: NONREACTIVE

## 2017-09-18 NOTE — Assessment & Plan Note (Signed)
Lab Results  Component Value Date   HGBA1C 6.1 10/09/2016  stable overall by history and exam, recent data reviewed with pt, and pt to continue medical treatment as before,  to f/u any worsening symptoms or concerns

## 2017-09-18 NOTE — Assessment & Plan Note (Signed)

## 2018-02-11 ENCOUNTER — Encounter: Payer: Self-pay | Admitting: Emergency Medicine

## 2018-02-11 ENCOUNTER — Ambulatory Visit: Payer: BLUE CROSS/BLUE SHIELD | Admitting: Emergency Medicine

## 2018-02-11 ENCOUNTER — Other Ambulatory Visit: Payer: Self-pay

## 2018-02-11 VITALS — BP 118/73 | HR 95 | Temp 98.3°F | Resp 16 | Ht 62.0 in | Wt 134.2 lb

## 2018-02-11 DIAGNOSIS — K2100 Gastro-esophageal reflux disease with esophagitis, without bleeding: Secondary | ICD-10-CM

## 2018-02-11 MED ORDER — PANTOPRAZOLE SODIUM 40 MG PO TBEC
40.0000 mg | DELAYED_RELEASE_TABLET | Freq: Every day | ORAL | 3 refills | Status: DC
Start: 2018-02-11 — End: 2018-05-26

## 2018-02-11 MED ORDER — RANITIDINE HCL 300 MG PO TABS: 300.0000 mg | ORAL_TABLET | Freq: Every day | ORAL | 1 refills | Status: DC

## 2018-02-11 NOTE — Progress Notes (Signed)
Roger Long 52 y.o.   Chief Complaint  Patient presents with  . Gastroesophageal Reflux    x 2 weeks    HISTORY OF PRESENT ILLNESS: This is a 52 y.o. male with a history of GERD, off medication for several weeks, complaining of burning epigastric pain with with discomfort radiating up into his chest and a sour taste in his mouth.  Has been what he describes as typical symptoms for his GERD.  Needs to be restarted on Protonix 40 mg a day.  Denies melena or rectal bleeding.  Able to eat and drink.  Denies nausea or vomiting.  Denies difficulty breathing.  Denies fever or chills.  Denies any other significant symptoms.  HPI   Prior to Admission medications   Medication Sig Start Date End Date Taking? Authorizing Provider  cetirizine (ZYRTEC) 10 MG tablet Take 1 tablet (10 mg total) by mouth daily. 09/17/17  Yes Biagio Borg, MD  levocetirizine (XYZAL) 5 MG tablet Take 1 tablet (5 mg total) by mouth every evening. 04/17/17  Yes Shawnee Knapp, MD  lisinopril (PRINIVIL,ZESTRIL) 10 MG tablet TAKE 1 TABLET(10 MG) BY MOUTH DAILY 09/17/17  Yes Biagio Borg, MD  pravastatin (PRAVACHOL) 40 MG tablet TAKE 1 TABLET(40 MG) BY MOUTH DAILY 09/17/17  Yes Biagio Borg, MD  aspirin EC 81 MG tablet Take 1 tablet (81 mg total) by mouth daily. Patient not taking: Reported on 02/11/2018 10/09/16   Biagio Borg, MD  pantoprazole (PROTONIX) 40 MG tablet Take 1 tablet (40 mg total) by mouth daily. Patient not taking: Reported on 02/11/2018 09/17/17   Biagio Borg, MD  triamcinolone cream (KENALOG) 0.1 % Apply 1 application topically 4 (four) times daily. Patient not taking: Reported on 02/11/2018 06/18/17   Shawnee Knapp, MD    No Known Allergies  Patient Active Problem List   Diagnosis Date Noted  . Mass of right side of neck 08/29/2015  . Impaired glucose tolerance 09/26/2011  . Preventative health care 09/20/2011  . Hyperlipidemia 08/06/2010  . GERD 08/06/2010  . Headache(784.0) 08/06/2010  . Allergic rhinitis  07/19/2009  . BACK PAIN 07/19/2009  . PLANTAR FASCIITIS 07/19/2009    Past Medical History:  Diagnosis Date  . ALLERGIC RHINITIS 07/19/2009  . BACK PAIN 07/19/2009  . GERD 08/06/2010  . Headache(784.0) 08/06/2010  . HYPERLIPIDEMIA 08/06/2010  . Hypertension   . Impaired glucose tolerance 09/26/2011  . PLANTAR FASCIITIS 07/19/2009    No past surgical history on file.  Social History   Socioeconomic History  . Marital status: Married    Spouse name: Not on file  . Number of children: Not on file  . Years of education: Not on file  . Highest education level: Not on file  Social Needs  . Financial resource strain: Not on file  . Food insecurity - worry: Not on file  . Food insecurity - inability: Not on file  . Transportation needs - medical: Not on file  . Transportation needs - non-medical: Not on file  Occupational History  . Occupation: Chief Operating Officer  Tobacco Use  . Smoking status: Former Research scientist (life sciences)  . Smokeless tobacco: Never Used  Substance and Sexual Activity  . Alcohol use: Yes    Alcohol/week: 0.0 oz    Comment: 3-4 beers  week  . Drug use: No  . Sexual activity: Not on file  Other Topics Concern  . Not on file  Social History Narrative   Came to [place] from [place] in  1994    Family History  Problem Relation Age of Onset  . Hypertension Mother   . Stroke Mother      Review of Systems  Constitutional: Negative.  Negative for chills, fever and malaise/fatigue.  HENT: Positive for sore throat. Negative for congestion and nosebleeds.   Eyes: Negative.  Negative for discharge and redness.  Respiratory: Negative.  Negative for cough and shortness of breath.   Cardiovascular: Positive for chest pain. Negative for palpitations and leg swelling.  Gastrointestinal: Positive for abdominal pain and heartburn. Negative for blood in stool, diarrhea, melena, nausea and vomiting.  Genitourinary: Negative.  Negative for dysuria and hematuria.    Musculoskeletal: Negative.  Negative for back pain, joint pain, myalgias and neck pain.  Skin: Negative.  Negative for rash.  Neurological: Negative.  Negative for dizziness and headaches.  Endo/Heme/Allergies: Negative.   All other systems reviewed and are negative.  Vitals:   02/11/18 1408  BP: 118/73  Pulse: 95  Resp: 16  Temp: 98.3 F (36.8 C)  SpO2: 96%     Physical Exam  Constitutional: He is oriented to person, place, and time. He appears well-developed and well-nourished.  HENT:  Head: Normocephalic and atraumatic.  Mouth/Throat: Uvula is midline. Posterior oropharyngeal erythema present. No oropharyngeal exudate or posterior oropharyngeal edema.  Eyes: Conjunctivae and EOM are normal. Pupils are equal, round, and reactive to light.  Neck: Normal range of motion. Neck supple. No thyromegaly present.  Cardiovascular: Normal rate, regular rhythm and normal heart sounds.  Pulmonary/Chest: Effort normal and breath sounds normal.  Abdominal: Soft. Bowel sounds are normal. He exhibits no distension. There is no tenderness.  Musculoskeletal: Normal range of motion.  Lymphadenopathy:    He has no cervical adenopathy.  Neurological: He is alert and oriented to person, place, and time. No sensory deficit. He exhibits normal muscle tone.  Skin: Skin is warm and dry. Capillary refill takes less than 2 seconds.  Psychiatric: He has a normal mood and affect. His behavior is normal.  Vitals reviewed.  A total of 25 minutes was spent in the room with the patient, greater than 50% of which was in counseling/coordination of care.   ASSESSMENT & PLAN: [name] was seen today for gastroesophageal reflux.  Diagnoses and all orders for this visit:  Gastroesophageal reflux disease with esophagitis -     pantoprazole (PROTONIX) 40 MG tablet; Take 1 tablet (40 mg total) by mouth daily. -     ranitidine (ZANTAC) 300 MG tablet; Take 1 tablet (300 mg total) by mouth at bedtime for 14  days.    Patient Instructions       IF you received an x-ray today, you will receive an invoice from [facility]. Please contact [facility] at [phone] with questions or concerns regarding your invoice.   IF you received labwork today, you will receive an invoice from [place] [facility]. Please contact [facility] at [phone] with questions or concerns regarding your invoice.   Our billing staff will not be able to assist you with questions regarding bills from these companies.  You will be contacted with the lab results as soon as they are available. The fastest way to get your results is to activate your My Chart account. Instructions are located on the last page of this paperwork. If you have not heard from [place] regarding the results in 2 weeks, please contact this office.     Gastroesophageal Reflux Disease, Adult Normally, food travels down the esophagus and stays in the stomach to  be digested. If a person has gastroesophageal reflux disease (GERD), food and stomach acid move back up into the esophagus. When this happens, the esophagus becomes sore and swollen (inflamed). Over time, GERD can make small holes (ulcers) in the lining of the esophagus. Follow these instructions at home: Diet  Follow a diet as told by your doctor. You may need to avoid foods and drinks such as: ? Coffee and tea (with or without caffeine). ? Drinks that contain alcohol. ? Energy drinks and sports drinks. ? Carbonated drinks or sodas. ? Chocolate and cocoa. ? Peppermint and mint flavorings. ? Garlic and onions. ? Horseradish. ? Spicy and acidic foods, such as peppers, chili powder, curry powder, vinegar, hot sauces, and BBQ sauce. ? Citrus fruit juices and citrus fruits, such as oranges, lemons, and limes. ? Tomato-based foods, such as red sauce, chili, salsa, and pizza with red sauce. ? Fried and fatty foods, such as donuts, french fries, potato chips, and high-fat  dressings. ? High-fat meats, such as hot dogs, rib eye steak, sausage, ham, and bacon. ? High-fat dairy items, such as whole milk, butter, and cream cheese.  Eat small meals often. Avoid eating large meals.  Avoid drinking large amounts of liquid with your meals.  Avoid eating meals during the 2-3 hours before bedtime.  Avoid lying down right after you eat.  Do not exercise right after you eat. General instructions  Pay attention to any changes in your symptoms.  Take over-the-counter and prescription medicines only as told by your doctor. Do not take aspirin, ibuprofen, or other NSAIDs unless your doctor says it is okay.  Do not use any tobacco products, including cigarettes, chewing tobacco, and e-cigarettes. If you need help quitting, ask your doctor.  Wear loose clothes. Do not wear anything tight around your waist.  Raise (elevate) the head of your bed about 6 inches (15 cm).  Try to lower your stress. If you need help doing this, ask your doctor.  If you are overweight, lose an amount of weight that is healthy for you. Ask your doctor about a safe weight loss goal.  Keep all follow-up visits as told by your doctor. This is important. Contact a doctor if:  You have new symptoms.  You lose weight and you do not know why it is happening.  You have trouble swallowing, or it hurts to swallow.  You have wheezing or a cough that keeps happening.  Your symptoms do not get better with treatment.  You have a hoarse voice. Get help right away if:  You have pain in your arms, neck, jaw, teeth, or back.  You feel sweaty, dizzy, or light-headed.  You have chest pain or shortness of breath.  You throw up (vomit) and your throw up looks like blood or coffee grounds.  You pass out (faint).  Your poop (stool) is bloody or black.  You cannot swallow, drink, or eat. This information is not intended to replace advice given to you by your health care provider. Make sure you  discuss any questions you have with your health care provider. Document Released: 05/18/2008 Document Revised: 05/07/2016 Document Reviewed: 03/27/2015 Elsevier Interactive Patient Education  2018 Elsevier Inc.      Agustina Caroli, MD [facility]

## 2018-02-11 NOTE — Patient Instructions (Addendum)
IF you received an x-ray today, you will receive an invoice from [facility]. Please contact [facility] at [phone] with questions or concerns regarding your invoice.   IF you received labwork today, you will receive an invoice from [facility]. Please contact [facility] at [phone] with questions or concerns regarding your invoice.   Our billing staff will not be able to assist you with questions regarding bills from these companies.  You will be contacted with the lab results as soon as they are available. The fastest way to get your results is to activate your My Chart account. Instructions are located on the last page of this paperwork. If you have not heard from [place] regarding the results in 2 weeks, please contact this office.     Gastroesophageal Reflux Disease, Adult Normally, food travels down the esophagus and stays in the stomach to be digested. If a person has gastroesophageal reflux disease (GERD), food and stomach acid move back up into the esophagus. When this happens, the esophagus becomes sore and swollen (inflamed). Over time, GERD can make small holes (ulcers) in the lining of the esophagus. Follow these instructions at home: Diet  Follow a diet as told by your doctor. You may need to avoid foods and drinks such as: ? Coffee and tea (with or without caffeine). ? Drinks that contain alcohol. ? Energy drinks and sports drinks. ? Carbonated drinks or sodas. ? Chocolate and cocoa. ? Peppermint and mint flavorings. ? Garlic and onions. ? Horseradish. ? Spicy and acidic foods, such as peppers, chili powder, curry powder, vinegar, hot sauces, and BBQ sauce. ? Citrus fruit juices and citrus fruits, such as oranges, lemons, and limes. ? Tomato-based foods, such as red sauce, chili, salsa, and pizza with red sauce. ? Fried and fatty foods, such as donuts, french fries, potato chips, and high-fat dressings. ? High-fat meats, such as hot dogs, rib eye  steak, sausage, ham, and bacon. ? High-fat dairy items, such as whole milk, butter, and cream cheese.  Eat small meals often. Avoid eating large meals.  Avoid drinking large amounts of liquid with your meals.  Avoid eating meals during the 2-3 hours before bedtime.  Avoid lying down right after you eat.  Do not exercise right after you eat. General instructions  Pay attention to any changes in your symptoms.  Take over-the-counter and prescription medicines only as told by your doctor. Do not take aspirin, ibuprofen, or other NSAIDs unless your doctor says it is okay.  Do not use any tobacco products, including cigarettes, chewing tobacco, and e-cigarettes. If you need help quitting, ask your doctor.  Wear loose clothes. Do not wear anything tight around your waist.  Raise (elevate) the head of your bed about 6 inches (15 cm).  Try to lower your stress. If you need help doing this, ask your doctor.  If you are overweight, lose an amount of weight that is healthy for you. Ask your doctor about a safe weight loss goal.  Keep all follow-up visits as told by your doctor. This is important. Contact a doctor if:  You have new symptoms.  You lose weight and you do not know why it is happening.  You have trouble swallowing, or it hurts to swallow.  You have wheezing or a cough that keeps happening.  Your symptoms do not get better with treatment.  You have a hoarse voice. Get help right away if:  You have pain in your arms, neck, jaw, teeth, or  back.  You feel sweaty, dizzy, or light-headed.  You have chest pain or shortness of breath.  You throw up (vomit) and your throw up looks like blood or coffee grounds.  You pass out (faint).  Your poop (stool) is bloody or black.  You cannot swallow, drink, or eat. This information is not intended to replace advice given to you by your health care provider. Make sure you discuss any questions you have with your health care  provider. Document Released: 05/18/2008 Document Revised: 05/07/2016 Document Reviewed: 03/27/2015 Elsevier Interactive Patient Education  [employer].

## 2018-03-23 ENCOUNTER — Ambulatory Visit (INDEPENDENT_AMBULATORY_CARE_PROVIDER_SITE_OTHER): Payer: BLUE CROSS/BLUE SHIELD | Admitting: Emergency Medicine

## 2018-03-23 ENCOUNTER — Encounter: Payer: Self-pay | Admitting: Emergency Medicine

## 2018-03-23 VITALS — BP 122/72 | HR 89 | Temp 98.2°F | Resp 18 | Ht 62.5 in | Wt 138.0 lb

## 2018-03-23 DIAGNOSIS — K2100 Gastro-esophageal reflux disease with esophagitis, without bleeding: Secondary | ICD-10-CM

## 2018-03-23 MED ORDER — RANITIDINE HCL 300 MG PO TABS: 300.0000 mg | ORAL_TABLET | Freq: Every day | ORAL | 3 refills | Status: DC

## 2018-03-23 NOTE — Progress Notes (Signed)
Roger Long 52 y.o.   Chief Complaint  Patient presents with  . Follow-up    gastro reflux     HISTORY OF PRESENT ILLNESS: This is a 52 y.o. male seen by me on 02/11/2018 with GERD and esophagitis.  Started on omeprazole and Zantac.  Symptoms improved.  Finished medication.  States Zantac works much better than omeprazole.  No new symptoms.  Denies melena or rectal bleeding.  Able to eat and drink.  Denies nausea or vomiting.  Persistent intermittent symptoms.  Last upper endoscopy was done 2 years ago.  GI  HPI   Prior to Admission medications   Medication Sig Start Date End Date Taking? Authorizing Provider  aspirin EC 81 MG tablet Take 1 tablet (81 mg total) by mouth daily. 10/09/16  Yes Biagio Borg, MD  cetirizine (ZYRTEC) 10 MG tablet Take 1 tablet (10 mg total) by mouth daily. 09/17/17  Yes Biagio Borg, MD  levocetirizine (XYZAL) 5 MG tablet Take 1 tablet (5 mg total) by mouth every evening. 04/17/17  Yes Shawnee Knapp, MD  lisinopril (PRINIVIL,ZESTRIL) 10 MG tablet TAKE 1 TABLET(10 MG) BY MOUTH DAILY 09/17/17  Yes Biagio Borg, MD  pantoprazole (PROTONIX) 40 MG tablet Take 1 tablet (40 mg total) by mouth daily. 02/11/18  Yes Horald Pollen, MD  pravastatin (PRAVACHOL) 40 MG tablet TAKE 1 TABLET(40 MG) BY MOUTH DAILY 09/17/17  Yes Biagio Borg, MD  triamcinolone cream (KENALOG) 0.1 % Apply 1 application topically 4 (four) times daily. 06/18/17  Yes Shawnee Knapp, MD  ranitidine (ZANTAC) 300 MG tablet Take 1 tablet (300 mg total) by mouth at bedtime for 14 days. 02/11/18 02/25/18  Horald Pollen, MD    No Known Allergies  Patient Active Problem List   Diagnosis Date Noted  . Mass of right side of neck 08/29/2015  . Impaired glucose tolerance 09/26/2011  . Preventative health care 09/20/2011  . Hyperlipidemia 08/06/2010  . GERD 08/06/2010  . Headache(784.0) 08/06/2010  . Allergic rhinitis 07/19/2009  . BACK PAIN 07/19/2009  . PLANTAR FASCIITIS 07/19/2009    Past  Medical History:  Diagnosis Date  . ALLERGIC RHINITIS 07/19/2009  . BACK PAIN 07/19/2009  . GERD 08/06/2010  . Headache(784.0) 08/06/2010  . HYPERLIPIDEMIA 08/06/2010  . Hypertension   . Impaired glucose tolerance 09/26/2011  . PLANTAR FASCIITIS 07/19/2009    No past surgical history on file.  Social History   Socioeconomic History  . Marital status: Married    Spouse name: Not on file  . Number of children: Not on file  . Years of education: Not on file  . Highest education level: Not on file  Occupational History  . Occupation: Chief Operating Officer  Social Needs  . Financial resource strain: Not on file  . Food insecurity:    Worry: Not on file    Inability: Not on file  . Transportation needs:    Medical: Not on file    Non-medical: Not on file  Tobacco Use  . Smoking status: Former Research scientist (life sciences)  . Smokeless tobacco: Never Used  Substance and Sexual Activity  . Alcohol use: Yes    Alcohol/week: 0.0 oz    Comment: 3-4 beers  week  . Drug use: No  . Sexual activity: Not on file  Lifestyle  . Physical activity:    Days per week: Not on file    Minutes per session: Not on file  . Stress: Not on file  Relationships  .  Social connections:    Talks on phone: Not on file    Gets together: Not on file    Attends religious service: Not on file    Active member of club or organization: Not on file    Attends meetings of clubs or organizations: Not on file    Relationship status: Not on file  . Intimate partner violence:    Fear of current or ex partner: Not on file    Emotionally abused: Not on file    Physically abused: Not on file    Forced sexual activity: Not on file  Other Topics Concern  . Not on file  Social History Narrative   Came to [place] from [place] in 1994    Family History  Problem Relation Age of Onset  . Hypertension Mother   . Stroke Mother      Review of Systems  Constitutional: Negative.  Negative for chills, fever and weight loss.    HENT: Negative.  Negative for sore throat.   Eyes: Negative.  Negative for discharge and redness.  Respiratory: Negative.  Negative for cough and shortness of breath.   Cardiovascular: Negative.  Negative for chest pain and palpitations.  Gastrointestinal: Positive for abdominal pain and heartburn. Negative for blood in stool, constipation, diarrhea, melena, nausea and vomiting.  Genitourinary: Negative.  Negative for dysuria and hematuria.  Musculoskeletal: Negative.  Negative for back pain, myalgias and neck pain.  Skin: Negative.  Negative for rash.  Neurological: Negative.  Negative for dizziness and headaches.  Endo/Heme/Allergies: Negative.   All other systems reviewed and are negative.  Vitals:   03/23/18 1621  BP: 122/72  Pulse: 89  Resp: 18  Temp: 98.2 F (36.8 C)  SpO2: 98%     Physical Exam  Constitutional: He is oriented to person, place, and time. He appears well-developed and well-nourished.  HENT:  Head: Normocephalic and atraumatic.  Nose: Nose normal.  Mouth/Throat: Oropharynx is clear and moist.  Eyes: Pupils are equal, round, and reactive to light. Conjunctivae and EOM are normal.  Neck: Normal range of motion. Neck supple.  Cardiovascular: Normal rate, regular rhythm and normal heart sounds.  Pulmonary/Chest: Effort normal and breath sounds normal.  Abdominal: Soft. Bowel sounds are normal. He exhibits no distension. There is no tenderness.  Musculoskeletal: Normal range of motion.  Neurological: He is alert and oriented to person, place, and time. No sensory deficit. He exhibits normal muscle tone.  Skin: Skin is warm and dry. Capillary refill takes less than 2 seconds. No rash noted.  Psychiatric: He has a normal mood and affect. His behavior is normal.  Vitals reviewed. GERD Persistent symptoms.  Denies melena or rectal bleeding.  Vital signs stable.  Omeprazole 40 mg not as effective.  We will continue Zantac 300 mg at bedtime.  Refer to GI for upper  endoscopy.  GERD diet provided.  We will follow-up after endoscopy.    A total of 24 minutes was spent in the room with the patient, greater than 50% of which was in counseling/coordination of care.  ASSESSMENT & PLAN: Amore was seen today for follow-up.  Diagnoses and all orders for this visit:  Gastroesophageal reflux disease with esophagitis -     Ambulatory referral to Gastroenterology -     ranitidine (ZANTAC) 300 MG tablet; Take 1 tablet (300 mg total) by mouth at bedtime.    Patient Instructions       IF you received an x-ray today, you will receive an invoice from  [facility]. Please contact [facility] at [phone] with questions or concerns regarding your invoice.   IF you received labwork today, you will receive an invoice from [place]. Please contact [facility] at [phone] with questions or concerns regarding your invoice.   Our billing staff will not be able to assist you with questions regarding bills from these companies.  You will be contacted with the lab results as soon as they are available. The fastest way to get your results is to activate your My Chart account. Instructions are located on the last page of this paperwork. If you have not heard from [place] regarding the results in 2 weeks, please contact this office.      Gastroesophageal Reflux Disease, Adult Normally, food travels down the esophagus and stays in the stomach to be digested. If a person has gastroesophageal reflux disease (GERD), food and stomach acid move back up into the esophagus. When this happens, the esophagus becomes sore and swollen (inflamed). Over time, GERD can make small holes (ulcers) in the lining of the esophagus. Follow these instructions at home: Diet  Follow a diet as told by your doctor. You may need to avoid foods and drinks such as: ? Coffee and tea (with or without caffeine). ? Drinks that contain alcohol. ? Energy drinks and sports  drinks. ? Carbonated drinks or sodas. ? Chocolate and cocoa. ? Peppermint and mint flavorings. ? Garlic and onions. ? Horseradish. ? Spicy and acidic foods, such as peppers, chili powder, curry powder, vinegar, hot sauces, and BBQ sauce. ? Citrus fruit juices and citrus fruits, such as oranges, lemons, and limes. ? Tomato-based foods, such as red sauce, chili, salsa, and pizza with red sauce. ? Fried and fatty foods, such as donuts, french fries, potato chips, and high-fat dressings. ? High-fat meats, such as hot dogs, rib eye steak, sausage, ham, and bacon. ? High-fat dairy items, such as whole milk, butter, and cream cheese.  Eat small meals often. Avoid eating large meals.  Avoid drinking large amounts of liquid with your meals.  Avoid eating meals during the 2-3 hours before bedtime.  Avoid lying down right after you eat.  Do not exercise right after you eat. General instructions  Pay attention to any changes in your symptoms.  Take over-the-counter and prescription medicines only as told by your doctor. Do not take aspirin, ibuprofen, or other NSAIDs unless your doctor says it is okay.  Do not use any tobacco products, including cigarettes, chewing tobacco, and e-cigarettes. If you need help quitting, ask your doctor.  Wear loose clothes. Do not wear anything tight around your waist.  Raise (elevate) the head of your bed about 6 inches (15 cm).  Try to lower your stress. If you need help doing this, ask your doctor.  If you are overweight, lose an amount of weight that is healthy for you. Ask your doctor about a safe weight loss goal.  Keep all follow-up visits as told by your doctor. This is important. Contact a doctor if:  You have new symptoms.  You lose weight and you do not know why it is happening.  You have trouble swallowing, or it hurts to swallow.  You have wheezing or a cough that keeps happening.  Your symptoms do not get better with treatment.  You  have a hoarse voice. Get help right away if:  You have pain in your arms, neck, jaw, teeth, or back.  You feel sweaty, dizzy, or light-headed.  You have chest pain  or shortness of breath.  You throw up (vomit) and your throw up looks like blood or coffee grounds.  You pass out (faint).  Your poop (stool) is bloody or black.  You cannot swallow, drink, or eat. This information is not intended to replace advice given to you by your health care provider. Make sure you discuss any questions you have with your health care provider. Document Released: 05/18/2008 Document Revised: 05/07/2016 Document Reviewed: 03/27/2015 Elsevier Interactive Patient Education  2018 [employer].  .   Agustina Caroli, MD [facility]

## 2018-03-23 NOTE — Patient Instructions (Addendum)
IF you received an x-ray today, you will receive an invoice from [facility]. Please contact [facility] at [phone] with questions or concerns regarding your invoice.   IF you received labwork today, you will receive an invoice from [place]. Please contact [facility] at [phone] with questions or concerns regarding your invoice.   Our billing staff will not be able to assist you with questions regarding bills from these companies.  You will be contacted with the lab results as soon as they are available. The fastest way to get your results is to activate your My Chart account. Instructions are located on the last page of this paperwork. If you have not heard from [place] regarding the results in 2 weeks, please contact this office.      Gastroesophageal Reflux Disease, Adult Normally, food travels down the esophagus and stays in the stomach to be digested. If a person has gastroesophageal reflux disease (GERD), food and stomach acid move back up into the esophagus. When this happens, the esophagus becomes sore and swollen (inflamed). Over time, GERD can make small holes (ulcers) in the lining of the esophagus. Follow these instructions at home: Diet  Follow a diet as told by your doctor. You may need to avoid foods and drinks such as: ? Coffee and tea (with or without caffeine). ? Drinks that contain alcohol. ? Energy drinks and sports drinks. ? Carbonated drinks or sodas. ? Chocolate and cocoa. ? Peppermint and mint flavorings. ? Garlic and onions. ? Horseradish. ? Spicy and acidic foods, such as peppers, chili powder, curry powder, vinegar, hot sauces, and BBQ sauce. ? Citrus fruit juices and citrus fruits, such as oranges, lemons, and limes. ? Tomato-based foods, such as red sauce, chili, salsa, and pizza with red sauce. ? Fried and fatty foods, such as donuts, french fries, potato chips, and high-fat dressings. ? High-fat meats, such as hot dogs, rib eye  steak, sausage, ham, and bacon. ? High-fat dairy items, such as whole milk, butter, and cream cheese.  Eat small meals often. Avoid eating large meals.  Avoid drinking large amounts of liquid with your meals.  Avoid eating meals during the 2-3 hours before bedtime.  Avoid lying down right after you eat.  Do not exercise right after you eat. General instructions  Pay attention to any changes in your symptoms.  Take over-the-counter and prescription medicines only as told by your doctor. Do not take aspirin, ibuprofen, or other NSAIDs unless your doctor says it is okay.  Do not use any tobacco products, including cigarettes, chewing tobacco, and e-cigarettes. If you need help quitting, ask your doctor.  Wear loose clothes. Do not wear anything tight around your waist.  Raise (elevate) the head of your bed about 6 inches (15 cm).  Try to lower your stress. If you need help doing this, ask your doctor.  If you are overweight, lose an amount of weight that is healthy for you. Ask your doctor about a safe weight loss goal.  Keep all follow-up visits as told by your doctor. This is important. Contact a doctor if:  You have new symptoms.  You lose weight and you do not know why it is happening.  You have trouble swallowing, or it hurts to swallow.  You have wheezing or a cough that keeps happening.  Your symptoms do not get better with treatment.  You have a hoarse voice. Get help right away if:  You have pain in your arms, neck, jaw, teeth,  or back.  You feel sweaty, dizzy, or light-headed.  You have chest pain or shortness of breath.  You throw up (vomit) and your throw up looks like blood or coffee grounds.  You pass out (faint).  Your poop (stool) is bloody or black.  You cannot swallow, drink, or eat. This information is not intended to replace advice given to you by your health care provider. Make sure you discuss any questions you have with your health care  provider. Document Released: 05/18/2008 Document Revised: 05/07/2016 Document Reviewed: 03/27/2015 Elsevier Interactive Patient Education  [employer].

## 2018-03-23 NOTE — Assessment & Plan Note (Signed)
Persistent symptoms.  Denies melena or rectal bleeding.  Vital signs stable.  Omeprazole 40 mg not as effective.  We will continue Zantac 300 mg at bedtime.  Refer to GI for upper endoscopy.  GERD diet provided.  We will follow-up after endoscopy.

## 2018-04-08 ENCOUNTER — Encounter: Payer: Self-pay | Admitting: Gastroenterology

## 2018-05-26 ENCOUNTER — Encounter: Payer: Self-pay | Admitting: Gastroenterology

## 2018-05-26 ENCOUNTER — Ambulatory Visit (INDEPENDENT_AMBULATORY_CARE_PROVIDER_SITE_OTHER): Payer: BLUE CROSS/BLUE SHIELD | Admitting: Gastroenterology

## 2018-05-26 VITALS — BP 122/72 | HR 64 | Ht 62.0 in | Wt 138.5 lb

## 2018-05-26 DIAGNOSIS — Z1211 Encounter for screening for malignant neoplasm of colon: Secondary | ICD-10-CM

## 2018-05-26 DIAGNOSIS — K219 Gastro-esophageal reflux disease without esophagitis: Secondary | ICD-10-CM

## 2018-05-26 DIAGNOSIS — K5900 Constipation, unspecified: Secondary | ICD-10-CM | POA: Diagnosis not present

## 2018-05-26 DIAGNOSIS — R140 Abdominal distension (gaseous): Secondary | ICD-10-CM | POA: Diagnosis not present

## 2018-05-26 MED ORDER — NA SULFATE-K SULFATE-MG SULF 17.5-3.13-1.6 GM/177ML PO SOLN: 1.0000 | Freq: Once | ORAL | 0 refills | Status: AC

## 2018-05-26 NOTE — Patient Instructions (Addendum)
Constipation:  Increase your intake of water, fruits/vegetables and your activity level.  Ducosate stool softener, 100 mg twice daily.  Benefiber 1 tablespoon in water once daily. If not improved in 1 week after starting that, please add:  Miralax powder  1 capful in a glass of water or juice once daily.  If you are age 50 or older, your body mass index should be between 23-30. Your Body mass index is 25.33 kg/m. If this is out of the aforementioned range listed, please consider follow up with your Primary Care Provider.  If you are age 6 or younger, your body mass index should be between 19-25. Your Body mass index is 25.33 kg/m. If this is out of the aformentioned range listed, please consider follow up with your Primary Care Provider.   You have been scheduled for an endoscopy and colonoscopy. Please follow the written instructions given to you at your visit today. Please pick up your prep supplies at the pharmacy within the next 1-3 days. If you use inhalers (even only as needed), please bring them with you on the day of your procedure.  It was a pleasure to meet you today!  Dr. Loletha Carrow

## 2018-05-26 NOTE — Progress Notes (Signed)
[facility] Consult Note:  History: Roger Long 05/26/2018  Referring physician: Biagio Borg, MD  Reason for consult/chief complaint: Gastroesophageal Reflux; Abdominal Pain (epigastric); Dizziness; and Headache   Subjective  HPI:  This is a pleasant 52 year old man referred by primary care and seen with a [place] interpreter.  He reports at least several years of GERD with regurgitation and pyrosis and a feeling of things "hung up" or full in the neck.  He reports having seen another GI doctor in town, possibly Dr. Benson [place], a few years back and thinks he had an upper endoscopy performed.  Those records are not available today.Siler also describes chronic constipation where he has 1 bowel movement per day that is usually hard with need to strain.  Sometimes he feels something protruding and wonders if it is a hemorrhoid.  He also has a general feeling of bloating most of the time, worse after meals. No rectal bleeding, anorexia or weight loss. I reviewed most recent primary care note, indicating that prescription strength ranitidine was more helpful to control his upper digestive symptoms then PPI had been.  ROS:  Review of Systems  Constitutional: Negative for appetite change and unexpected weight change.  HENT: Negative for mouth sores and voice change.   Eyes: Negative for pain and redness.  Respiratory: Negative for cough and shortness of breath.   Cardiovascular: Negative for chest pain and palpitations.  Genitourinary: Negative for dysuria and hematuria.  Musculoskeletal: Negative for arthralgias and myalgias.  Skin: Negative for pallor and rash.  Neurological: Positive for dizziness and headaches. Negative for weakness.  Hematological: Negative for adenopathy.     Past Medical History: Past Medical History:  Diagnosis Date  . ALLERGIC RHINITIS 07/19/2009  . BACK PAIN 07/19/2009  . GERD 08/06/2010  . Headache(784.0) 08/06/2010  . HYPERLIPIDEMIA 08/06/2010   . Hypertension   . Impaired glucose tolerance 09/26/2011  . PLANTAR FASCIITIS 07/19/2009     Past Surgical History: History reviewed. No pertinent surgical history. No prior surgery  Family History: Family History  Problem Relation Age of Onset  . Hypertension Mother   . Stroke Mother    No colon, gastric or esophageal cancer  Social History: Social History   Socioeconomic History  . Marital status: Married    Spouse name: Not on file  . Number of children: Not on file  . Years of education: Not on file  . Highest education level: Not on file  Occupational History  . Occupation: Chief Operating Officer  Social Needs  . Financial resource strain: Not on file  . Food insecurity:    Worry: Not on file    Inability: Not on file  . Transportation needs:    Medical: Not on file    Non-medical: Not on file  Tobacco Use  . Smoking status: Former Research scientist (life sciences)  . Smokeless tobacco: Never Used  Substance and Sexual Activity  . Alcohol use: Yes    Alcohol/week: 0.0 oz    Comment: 3-4 beers  week  . Drug use: No  . Sexual activity: Not on file  Lifestyle  . Physical activity:    Days per week: Not on file    Minutes per session: Not on file  . Stress: Not on file  Relationships  . Social connections:    Talks on phone: Not on file    Gets together: Not on file    Attends religious service: Not on file    Active member of club or organization: Not  on file    Attends meetings of clubs or organizations: Not on file    Relationship status: Not on file  Other Topics Concern  . Not on file  Social History Narrative   Came to [place] from [place] in 1994    Allergies: No Known Allergies  Outpatient Meds: Current Outpatient Medications  Medication Sig Dispense Refill  . cetirizine (ZYRTEC) 10 MG tablet Take 1 tablet (10 mg total) by mouth daily. (Patient taking differently: Take 10 mg by mouth daily as needed. ) 90 tablet 3  . levocetirizine (XYZAL) 5 MG tablet  Take 1 tablet (5 mg total) by mouth every evening. (Patient taking differently: Take 5 mg by mouth at bedtime as needed. ) 30 tablet 5  . lisinopril (PRINIVIL,ZESTRIL) 10 MG tablet TAKE 1 TABLET(10 MG) BY MOUTH DAILY 90 tablet 3  . pravastatin (PRAVACHOL) 40 MG tablet TAKE 1 TABLET(40 MG) BY MOUTH DAILY 90 tablet 3  . Na Sulfate-K Sulfate-Mg Sulf 17.5-3.13-1.6 GM/177ML SOLN Take 1 kit by mouth once for 1 dose. 354 mL 0  . ranitidine (ZANTAC) 300 MG tablet Take 1 tablet (300 mg total) by mouth at bedtime. 30 tablet 3   No current facility-administered medications for this visit.       ___________________________________________________________________ Objective   Exam:  BP 122/72   Pulse 64   Ht _0  (1.575 m)   Wt 138 lb 8 oz (62.8 kg)   BMI 25.33 kg/m    General: this is a(n) well-appearing man, normal vocal quality  Eyes: sclera anicteric, no redness  ENT: oral mucosa moist without lesions, no cervical or supraclavicular lymphadenopathy, good dentition  CV: RRR without murmur, S1/S2, no JVD, no peripheral edema  Resp: clear to auscultation bilaterally, normal RR and effort noted  GI: soft, no tenderness, with active bowel sounds. No guarding or palpable organomegaly noted.  Skin; warm and dry, no rash or jaundice noted  Neuro: awake, alert and oriented x 3. Normal gross motor function and fluent speech  Labs:  CBC Latest Ref Rng & Units 09/17/2017 10/09/2016 08/29/2015  WBC 4.0 - 10.5 K/uL 8.2 9.8 9.4  Hemoglobin 13.0 - 17.0 g/dL 14.5 15.3 14.8  Hematocrit 39.0 - 52.0 % 44.4 45.7 44.8  Platelets 150.0 - 400.0 K/uL 351.0 337.0 336.0   CMP Latest Ref Rng & Units 09/17/2017 10/09/2016 08/29/2015  Glucose 70 - 99 mg/dL 111(H) 100(H) 94  BUN 6 - 23 mg/dL _1 Creatinine 0.40 - 1.50 mg/dL 0.75 0.83 0.95  Sodium 135 - 145 mEq/L 140 139 141  Potassium 3.5 - 5.1 mEq/L 4.1 5.0 4.7  Chloride 96 - 112 mEq/L 101 101 104  CO2 19 - 32 mEq/L 30 32 30  Calcium 8.4 - 10.5  mg/dL 8.8 9.6 8.9  Total Protein 6.0 - 8.3 g/dL 7.5 7.7 7.2  Total Bilirubin 0.2 - 1.2 mg/dL 0.3 0.3 0.4  Alkaline Phos 39 - 117 U/L 61 59 60  AST 0 - 37 U/L _2 ALT 0 - 53 U/L _3 Normal TSH 10/18  Assessment: Encounter Diagnoses  Name Primary?  . Gastroesophageal reflux disease, esophagitis presence not specified Yes  . Abdominal bloating   . Constipation, unspecified constipation type   . Special screening for malignant neoplasms, colon    Unknown prior findings on upper endoscopy years ago.  Ongoing reflux symptoms despite medication.  Diet and lifestyle measures reviewed.  Chronic constipation that I think is contributing to his bloating,  possibly functional, less likely obstructive.  Plan:  Upper endoscopy Screening colonoscopy   The benefits and risks of the planned procedure were described in detail with the patient or (when appropriate) their health care proxy.  Risks were outlined as including, but not limited to, bleeding, infection, perforation, adverse medication reaction leading to cardiac or pulmonary decompensation, or pancreatitis (if ERCP).  The limitation of incomplete mucosal visualization was also discussed.  No guarantees or warranties were given.  Stool softener on a daily fiber supplement.  If not improved soon, have MiraLAX.  Thank you for the courtesy of this consult.  Please call me with any questions or concerns.  Nelida Meuse III  CC: Biagio Borg, MD

## 2018-06-16 ENCOUNTER — Other Ambulatory Visit: Payer: Self-pay | Admitting: Family Medicine

## 2018-06-17 NOTE — Telephone Encounter (Signed)
Prescription for levocetirizine 5 mg tab expired on 04/17/18 LOV  09/27/17 NOV  09/26/18 Dr. Orie Rout [phone]

## 2018-07-05 ENCOUNTER — Encounter: Payer: Self-pay | Admitting: Gastroenterology

## 2018-07-05 ENCOUNTER — Ambulatory Visit (AMBULATORY_SURGERY_CENTER): Payer: BLUE CROSS/BLUE SHIELD | Admitting: Gastroenterology

## 2018-07-05 VITALS — BP 88/50 | HR 84 | Temp 99.1°F | Resp 13 | Ht 62.0 in | Wt 138.0 lb

## 2018-07-05 DIAGNOSIS — K219 Gastro-esophageal reflux disease without esophagitis: Secondary | ICD-10-CM

## 2018-07-05 DIAGNOSIS — K635 Polyp of colon: Secondary | ICD-10-CM

## 2018-07-05 DIAGNOSIS — D125 Benign neoplasm of sigmoid colon: Secondary | ICD-10-CM | POA: Diagnosis not present

## 2018-07-05 DIAGNOSIS — R140 Abdominal distension (gaseous): Secondary | ICD-10-CM

## 2018-07-05 DIAGNOSIS — B9681 Helicobacter pylori [H. pylori] as the cause of diseases classified elsewhere: Secondary | ICD-10-CM | POA: Diagnosis not present

## 2018-07-05 DIAGNOSIS — D122 Benign neoplasm of ascending colon: Secondary | ICD-10-CM

## 2018-07-05 DIAGNOSIS — K2950 Unspecified chronic gastritis without bleeding: Secondary | ICD-10-CM | POA: Diagnosis not present

## 2018-07-05 DIAGNOSIS — Z1211 Encounter for screening for malignant neoplasm of colon: Secondary | ICD-10-CM

## 2018-07-05 MED ORDER — SODIUM CHLORIDE 0.9 % IV SOLN: 500.0000 mL | Freq: Once | INTRAVENOUS | Status: DC

## 2018-07-05 NOTE — Op Note (Signed)
[name] Procedure Date: 07/05/2018 2:35 PM MRN: [id number] Endoscopist: Mallie Mussel L. Loletha Carrow , MD Age: 52 Referring MD:  Date of Birth: 1966/01/14 Gender: Male Account #: [id number] Procedure:                Upper GI endoscopy Indications:              Epigastric abdominal pain, Heartburn, Abdominal                            bloating Medicines:                Monitored Anesthesia Care Procedure:                Pre-Anesthesia Assessment:                           - Prior to the procedure, a History and Physical                            was performed, and patient medications and                            allergies were reviewed. The patient's tolerance of                            previous anesthesia was also reviewed. The risks                            and benefits of the procedure and the sedation                            options and risks were discussed with the patient.                            All questions were answered, and informed consent                            was obtained. Prior Anticoagulants: The patient has                            taken no previous anticoagulant or antiplatelet                            agents. ASA Grade Assessment: II - A patient with                            mild systemic disease. After reviewing the risks                            and benefits, the patient was deemed in                            satisfactory condition to undergo the procedure.  After obtaining informed consent, the endoscope was                            passed under direct vision. Throughout the                            procedure, the patient's blood pressure, pulse, and                            oxygen saturations were monitored continuously. The                            Endoscope was introduced through the mouth, and                            advanced to the second part of duodenum. The upper                         GI endoscopy was accomplished without difficulty.                            The patient tolerated the procedure well. Scope In: Scope Out: Findings:                 The esophagus was normal.                           Patchy mild inflammation characterized by small                            flecks of adherent blood and congestion (edema) was                            found in the gastric antrum. Biopsies were taken                            with a cold forceps for histology. ([place]                            protocol).                           The cardia and gastric fundus were normal on                            retroflexion.                           The examined duodenum was normal. Complications:            No immediate complications. Estimated Blood Loss:     Estimated blood loss was minimal. Impression:               - Normal esophagus.                           - Gastritis. Biopsied.                           -  Normal examined duodenum. Recommendation:           - Patient has a contact number available for                            emergencies. The signs and symptoms of potential                            delayed complications were discussed with the                            patient. Return to normal activities tomorrow.                            Written discharge instructions were provided to the                            patient.                           - Resume previous diet.                           - Continue present medications.                           - Await pathology results.                           - See the other procedure note for documentation of                            additional recommendations.  L. Loletha Carrow, MD 07/05/2018 3:12:07 PM This report has been signed electronically.

## 2018-07-05 NOTE — Progress Notes (Signed)
Pt's states no medical or surgical changes since previsit or office visit. 

## 2018-07-05 NOTE — Progress Notes (Signed)
A and O x3. Report to RN. Tolerated MAC anesthesia well.

## 2018-07-05 NOTE — Progress Notes (Signed)
Called to room to assist during endoscopic procedure.  Patient ID and intended procedure confirmed with present staff. Received instructions for my participation in the procedure from the performing physician.  

## 2018-07-05 NOTE — Progress Notes (Signed)
Teeth unchanged after procedure. 

## 2018-07-05 NOTE — Op Note (Signed)
[facility] Patient Name: Roger Long Procedure Date: 07/05/2018 2:34 PM MRN: [id number] Endoscopist: Mallie Mussel L. Loletha Carrow , MD Age: 52 Referring MD:  Date of Birth: 27-Feb-1966 Gender: Male Account #: [id number] Procedure:                Colonoscopy Indications:              Screening for colorectal malignant neoplasm, This                            is the patient's first colonoscopy Medicines:                Monitored Anesthesia Care Procedure:                Pre-Anesthesia Assessment:                           - Prior to the procedure, a History and Physical                            was performed, and patient medications and                            allergies were reviewed. The patient's tolerance of                            previous anesthesia was also reviewed. The risks                            and benefits of the procedure and the sedation                            options and risks were discussed with the patient.                            All questions were answered, and informed consent                            was obtained. Prior Anticoagulants: The patient has                            taken no previous anticoagulant or antiplatelet                            agents. ASA Grade Assessment: II - A patient with                            mild systemic disease. After reviewing the risks                            and benefits, the patient was deemed in                            satisfactory condition to undergo the procedure.  After obtaining informed consent, the colonoscope                            was passed under direct vision. Throughout the                            procedure, the patient's blood pressure, pulse, and                            oxygen saturations were monitored continuously. The                            Colonoscope was introduced through the anus and                            advanced to the the terminal  ileum, with                            identification of the appendiceal orifice and IC                            valve. The colonoscopy was performed without                            difficulty. The patient tolerated the procedure                            well. The quality of the bowel preparation was                            excellent. The terminal ileum, ileocecal valve,                            appendiceal orifice, and rectum were photographed. Scope In: 2:53:18 PM Scope Out: 3:06:00 PM Scope Withdrawal Time: 0 hours 11 minutes 0 seconds  Total Procedure Duration: 0 hours 12 minutes 42 seconds  Findings:                 The perianal and digital rectal examinations were                            normal.                           The terminal ileum appeared normal.                           Two sessile polyps were found in the distal sigmoid                            colon and distal ascending colon. The polyps were 3                            to 4 mm in size. These polyps were removed with a  cold snare. Resection and retrieval were complete.                           The exam was otherwise without abnormality on                            direct and retroflexion views. Complications:            No immediate complications. Estimated Blood Loss:     Estimated blood loss was minimal. Impression:               - The examined portion of the ileum was normal.                           - Two 3 to 4 mm polyps in the distal sigmoid colon                            and in the distal ascending colon, removed with a                            cold snare. Resected and retrieved.                           - The examination was otherwise normal on direct                            and retroflexion views. Recommendation:           - Patient has a contact number available for                            emergencies. The signs and symptoms of potential                             delayed complications were discussed with the                            patient. Return to normal activities tomorrow.                            Written discharge instructions were provided to the                            patient.                           - Resume previous diet.                           - Continue present medications.                           - Await pathology results.                           - Repeat colonoscopy is recommended for  surveillance. The colonoscopy date will be                            determined after pathology results from today's                            exam become available for review. Henry L. Loletha Carrow, MD 07/05/2018 3:15:37 PM This report has been signed electronically.

## 2018-07-05 NOTE — Patient Instructions (Signed)
Handout given on polyps.   YOU HAD AN ENDOSCOPIC PROCEDURE TODAY AT THE [facility]:   Refer to the procedure report that was given to you for any specific questions about what was found during the examination.  If the procedure report does not answer your questions, please call your gastroenterologist to clarify.  If you requested that your care partner not be given the details of your procedure findings, then the procedure report has been included in a sealed envelope for you to review at your convenience later.  YOU SHOULD EXPECT: Some feelings of bloating in the abdomen. Passage of more gas than usual.  Walking can help get rid of the air that was put into your GI tract during the procedure and reduce the bloating. If you had a lower endoscopy (such as a colonoscopy or flexible sigmoidoscopy) you may notice spotting of blood in your stool or on the toilet paper. If you underwent a bowel prep for your procedure, you may not have a normal bowel movement for a few days.  Please Note:  You might notice some irritation and congestion in your nose or some drainage.  This is from the oxygen used during your procedure.  There is no need for concern and it should clear up in a day or so.  SYMPTOMS TO REPORT IMMEDIATELY:   Following lower endoscopy (colonoscopy or flexible sigmoidoscopy):  Excessive amounts of blood in the stool  Significant tenderness or worsening of abdominal pains  Swelling of the abdomen that is new, acute  Fever of 100F or higher   Following upper endoscopy (EGD)  Vomiting of blood or coffee ground material  New chest pain or pain under the shoulder blades  Painful or persistently difficult swallowing  New shortness of breath  Fever of 100F or higher  Black, tarry-looking stools  For urgent or emergent issues, a gastroenterologist can be reached at any hour by calling [phone].   DIET:  We do recommend a small meal at first, but then you may proceed  to your regular diet.  Drink plenty of fluids but you should avoid alcoholic beverages for 24 hours.  ACTIVITY:  You should plan to take it easy for the rest of today and you should NOT DRIVE or use heavy machinery until tomorrow (because of the sedation medicines used during the test).    FOLLOW UP: Our staff will call the number listed on your records the next business day following your procedure to check on you and address any questions or concerns that you may have regarding the information given to you following your procedure. If we do not reach you, we will leave a message.  However, if you are feeling well and you are not experiencing any problems, there is no need to return our call.  We will assume that you have returned to your regular daily activities without incident.  If any biopsies were taken you will be contacted by phone or by letter within the next 1-3 weeks.  Please call us at [phone] if you have not heard about the biopsies in 3 weeks.    SIGNATURES/CONFIDENTIALITY: You and/or your care partner have signed paperwork which will be entered into your electronic medical record.  These signatures attest to the fact that that the information above on your After Visit Summary has been reviewed and is understood.  Full responsibility of the confidentiality of this discharge information lies with you and/or your care-partner. 

## 2018-07-05 NOTE — Progress Notes (Signed)
Pt and family asked MD billing questions.  RN referred them to the financial section of their pre-visit instructions and highlighted phone number. Roger Long/[facility]

## 2018-07-06 ENCOUNTER — Telehealth: Payer: Self-pay | Admitting: *Deleted

## 2018-07-06 ENCOUNTER — Telehealth: Payer: Self-pay

## 2018-07-06 NOTE — Telephone Encounter (Signed)
  Follow up Call-  Call back number 07/05/2018  Post procedure Call Back phone  # [phone]  Permission to leave phone message Yes  Some recent data might be hidden     Roger Long/Call-back [place]

## 2018-07-06 NOTE — Telephone Encounter (Signed)
  Follow up Call-  Call back number 07/05/2018  Post procedure Call Back phone  # [phone]  Permission to leave phone message Yes  Some recent data might be hidden     Patient questions:  Do you have a fever, pain , or abdominal swelling? No. Pain Score  0 *  Have you tolerated food without any problems? Yes.    Have you been able to return to your normal activities? Yes.    Do you have any questions about your discharge instructions: Diet   No. Medications  No. Follow up visit  No.  Do you have questions or concerns about your Care? No.  Actions: * If pain score is 4 or above: No action needed, pain <4.

## 2018-07-10 ENCOUNTER — Encounter: Payer: Self-pay | Admitting: Gastroenterology

## 2018-07-11 ENCOUNTER — Other Ambulatory Visit: Payer: Self-pay

## 2018-07-11 DIAGNOSIS — A048 Other specified bacterial intestinal infections: Secondary | ICD-10-CM

## 2018-07-11 MED ORDER — DOXYCYCLINE HYCLATE 100 MG PO CAPS: 100.0000 mg | ORAL_CAPSULE | Freq: Two times a day (BID) | ORAL | 0 refills | Status: AC

## 2018-07-11 MED ORDER — BISMUTH SUBSALICYLATE 262 MG PO CHEW: 524.0000 mg | CHEWABLE_TABLET | Freq: Four times a day (QID) | ORAL | 0 refills | Status: DC

## 2018-07-11 MED ORDER — METRONIDAZOLE 250 MG PO TABS: 250.0000 mg | ORAL_TABLET | Freq: Four times a day (QID) | ORAL | 0 refills | Status: DC

## 2018-08-05 ENCOUNTER — Other Ambulatory Visit: Payer: Self-pay | Admitting: Gastroenterology

## 2018-08-12 LAB — H. PYLORI BREATH TEST

## 2018-08-19 ENCOUNTER — Telehealth: Payer: Self-pay | Admitting: Gastroenterology

## 2018-08-23 ENCOUNTER — Encounter: Payer: Self-pay | Admitting: Gastroenterology

## 2018-08-23 ENCOUNTER — Ambulatory Visit (INDEPENDENT_AMBULATORY_CARE_PROVIDER_SITE_OTHER): Payer: BLUE CROSS/BLUE SHIELD | Admitting: Gastroenterology

## 2018-08-23 VITALS — BP 110/70 | HR 84 | Ht 62.5 in | Wt 136.8 lb

## 2018-08-23 DIAGNOSIS — K3189 Other diseases of stomach and duodenum: Secondary | ICD-10-CM | POA: Diagnosis not present

## 2018-08-23 DIAGNOSIS — A048 Other specified bacterial intestinal infections: Secondary | ICD-10-CM

## 2018-08-23 DIAGNOSIS — K31A0 Gastric intestinal metaplasia, unspecified: Secondary | ICD-10-CM

## 2018-08-23 DIAGNOSIS — K219 Gastro-esophageal reflux disease without esophagitis: Secondary | ICD-10-CM | POA: Diagnosis not present

## 2018-08-23 DIAGNOSIS — D126 Benign neoplasm of colon, unspecified: Secondary | ICD-10-CM | POA: Diagnosis not present

## 2018-08-23 NOTE — Progress Notes (Signed)
     [facility] Progress Note  Chief Complaint: GERD and bloating  Subjective  History:  This is a 52 year old [place] man who saw me in mid June for chronic symptoms of GERD and bloating. Upper endoscopy on colonoscopy June 23: H. pylori gastritis with intestinal metaplasia, 2 diminutive adenomatous colon polyps. He was treated with bismuth based quadruple therapy for H. pylori.  A urea breath test was scheduled for several weeks ago, but was canceled by lab because "specimen could not be located".  He is feeling well, with about 80% improvement in reflux symptoms.  He is never really had much in the way of heartburn, so H2 blockers and PPI have not been very helpful by his report.  He still has intermittent regurgitation with sour brash. He denies dysphagia, his appetite is good and weight stable.  He is currently not taking any acid suppression. ROS: Cardiovascular:  no chest pain Respiratory: no dyspnea  The patient's Past Medical, Family and Social History were reviewed and are on file in the EMR.  Objective:  Med list reviewed  Current Outpatient Medications:  .  lisinopril (PRINIVIL,ZESTRIL) 10 MG tablet, TAKE 1 TABLET(10 MG) BY MOUTH DAILY, Disp: 90 tablet, Rfl: 3 .  Multiple Vitamins-Minerals (MULTIVITAMIN ADULT PO), Take 1 tablet by mouth daily., Disp: , Rfl:  .  cetirizine (ZYRTEC) 10 MG tablet, Take 1 tablet (10 mg total) by mouth daily. (Patient not taking: Reported on 08/23/2018), Disp: 90 tablet, Rfl: 3 .  levocetirizine (XYZAL) 5 MG tablet, Take 1 tablet (5 mg total) by mouth daily. (Patient not taking: Reported on 08/23/2018), Disp: 30 tablet, Rfl: 0 .  pravastatin (PRAVACHOL) 40 MG tablet, TAKE 1 TABLET(40 MG) BY MOUTH DAILY (Patient not taking: Reported on 08/23/2018), Disp: 90 tablet, Rfl: 3   Vital signs in last 24 hrs: Vitals:   08/23/18 0820  BP: 110/70  Pulse: 84    Physical Exam  He is well-appearing with normal vocal quality  HEENT: sclera  anicteric, oral mucosa moist without lesions  Cardiac: RRR without murmurs, S1S2 heard, no peripheral edema  Pulm: clear to auscultation bilaterally, normal RR and effort noted  Abdomen: soft, no tenderness, with active bowel sounds. No guarding or palpable hepatosplenomegaly.   Radiologic studies: Gastric and colon biopsy reports as noted above and on file   @ASSESSMENTPLANBEGIN @ Assessment: Encounter Diagnoses  Name Primary?  . Gastroesophageal reflux disease without esophagitis   . H. pylori infection Yes  . Intestinal metaplasia of gastric mucosa   . Adenomatous polyp of colon, unspecified part of colon     Diet and lifestyle measures were reviewed to control regurgitation, written materials given as well.  It does not seem that he needs regular dose of acid suppression since he has occasional regurgitation that is brief.  If he has a severe episode lasting heartburn, he could take a Pepcid Complete, liquid antacid such as Maalox or omeprazole.  I explained how the H. pylori induced intestinal metaplasia increases his risk of gastric cancer and needs future upper endoscopy for surveillance biopsies.  Plan: Reschedule urea breath test to confirm eradication. We have placed a recall for EGD in 3 years for intestinal metaplasia.   Total time 15 minutes, over half spent face-to-face with patient in counseling and coordination of care.   Nelida Meuse III

## 2018-08-23 NOTE — Patient Instructions (Signed)
If you are age 50 or older, your body mass index should be between 23-30. Your Body mass index is 24.62 kg/m. If this is out of the aforementioned range listed, please consider follow up with your Primary Care Provider.  If you are age 70 or younger, your body mass index should be between 19-25. Your Body mass index is 24.62 kg/m. If this is out of the aformentioned range listed, please consider follow up with your Primary Care Provider.   You will be due for a recall EGD in 08-2021. We will send you a reminder in the mail when it gets closer to that time.  It was a pleasure to see you today!  Dr. Loletha Carrow    L?a Ch?n Th?c ?n cho B?nh Tro Ng??c D? Dy Th?c Qu?n, Ng??i L?n Food Choices for Gastroesophageal Reflux Disease, Adult Khi qu v? b? b?nh tro ng??c d? dy th?c qu?n (GERD), th?c ph?m qu v? ?n v thi quen ?n u?ng c?a qu v? l r?t quan tr?ng. L?a ch?n th?c ph?m ?ng c th? gip lm gi?m s? kh ch?u c?a b?nh tro ng??c d? dy th?c qu?n (GERD). Cn nh?c lm vi?c v?i m?t chuyn gia v? ch? ?? ?n v dinh d??ng (chuyn gia dinh d??ng) nh?m gip qu v? l?a ch?n nh?ng th?c ph?m t?t cho s?c kh?e. Nh?ng h??ng d?n chung no ti ph?i tun theo? K? ho?ch ?n u?ng  Ch?n nh?ng th?c ph?m t?t cho s?c kh?e t ch?t bo, ch?ng h?n nh? tri cy, rau c?, ng? c?c nguyn h?t, cc s?n ph?m t? s?a t bo, v th?t n?c, c, v th?t gia c?m.  ?n cc b?a nh?, th??ng xuyn thay v ba b?a l?n m?i ngy. ?n ch?m ri, trong khng gian th? gin. Trnh g?p ng??i ho?c n?m xu?ng cho ??n khi ?n xong ???c 2-3 gi?.  H?n ch? cc th?c ph?m giu ch?t bo ch?ng h?n nh? th?t nhi?u m? ho?c th?c ph?m chin.  Gi?i h?n l??ng tiu th? d?u, b?, v ch?t bo t? d?u th?c v?t ? m?c d??i 8 mu?ng c ph m?i ngy.  Tra?nh nh??ng th?? sau: ? Cc th?c ph?m gy ra cc tri?u ch?ng. Nh?ng tri?u ch?ng ny c th? khc nhau ??i v?i nh?ng ng??i khc nhau. Hy ghi nh?t k th?c ph?m ?? theo di nh?ng th?c ph?m no gy ra cc tri?u  ch?ng. ? R??u. ? U?ng nhi?u ch?t l?ng khi ?n. ? ?n trong kho?ng 2-3 gi? tr??c khi ?i ng?.  N?u ?n b?ng cc ph??ng php khc thay vi? chin. Ph??ng php ny c th? bao g?m b? l, n??ng, ho?c hun nng. L?i s?ng   Duy tr m??c cn n?ng c l?i cho s?c kh?e. H?i chuyn gia ch?m [place] s?c kh?e c?a quy? vi? xem m??c cn n??ng na?o la? t?t cho quy? vi?. N?u qu v? c?n gi?m cn, hy trao ??i v?i chuyn gia ch?m [place] s?c kh?e c?a qu v? ?? gi?m cn m?t cch an ton.  T?p th? d?c t?i thi?u 30 pht t? 5 ngy tr? ln m?i tu?n, ho?c theo ch? d?n c?a chuyn gia ch?m [place] s?c kh?e.  Trnh m?c qu?n o b ch?t quanh th?t l?ng v ng?c.  Khng s? d?ng b?t k? s?n ph?m no ch?a nicotine ho?c thu?c l, ch?ng ha?n nh? thu?c l d?ng ht v thu?c l ?i?n t?. N?u qu v? c?n gip ?? ?? cai thu?c, hy h?i chuyn gia ch?m [place] s?c kh?e.  Ng? v?i ??u gi??ng k cao. S? d?ng chm d??i  n?m ho?c t?m k d??i khung gi??ng ?? nng ??u gi??ng ln. Nh?ng lo?i th?c ?n no khng ???c khuyn dng? Nh?ng m?c ???c li?t k c th? khng ph?i danh sch ??y ??. Hy trao ??i v?i bc s? chuyn khoa dinh d??ng xem cc l?a ch?n ch? ?? dinh d??ng no ph h?p nh?t v?i qu v?. Ng? c?c Bnh ng?t ho?c bnh m n??ng nhanh b? sung ch?t bo. Bnh m n??ng c?a Php. Rau c? Marlou Starks c? chin ng?p d?u. Khoai ty chin. M?i lo?i rau c? ???c ch? bi?n km thm ch?t bo. M?i lo?i rau c? gy ra cc tri?u ch?ng. ??i v?i m?t s? ng??i, nh?ng lo?i rau c? ny c th? bao g?m c chua v cc s?n ph?m t? c chua, ?t, hnh v t?i, v c?i ng?a. Tri cy M?i lo?i tri cy ???c ch? bi?n km thm ch?t bo. M?i lo?i tri cy c th? gy ra cc tri?u ch?ng. ??i v?i m?t s? ng??i, nh?ng lo?i tri cy ny c th? bao g?m tri cy h? cam qut, ch?ng h?n nh? cam, b??i, qu? d?a, v chanh. Th?t v cc th?c ph?m ch?a protein khc Th?t giu ch?t bo, ch?ng h?n nh? th?t l?n ho?c th?t b nhi?u m?, xc xch, s??n, gi?m bng, l?p x??ng, [place] v th?t l?n mu?i xng khi. Th?t chin ho?c protein,  bao g?m c chin v th?t g chin. Qu? h?ch v b? h?t. S?a. S?a nguyn kem v s?a s-c-la. Kem chuaSalome Spotted. Kem. Pho mt kem. S?a l?c. ?? u?ng C ph v tr, c ho?c khng c caffeine. ?? u?ng c ga. Soda. ?? u?ng t?ng l?c. N??c p tri cy lm t? tri cy chua (ch?ng h?n nh? cam ho?c b??i). N??c p c chua. ?? u?ng ch?a c?n. M? v d?u B?. B? th?c v?t. Ch?t bo t? d?u th?c v?t. B? s??a tru. K?o v ?? trng mi?ng S-c-la v c ca. Bnh rnCassell Smiles v? v cc th?c ph?m khc. H?t tiu. B?c h v b?c h l?c. M?i lo?i gia v?, th?o d??c, ho?c gia v? gy ra cc tri?u ch?ng. ??i v?i m?t s? ng??i, gia v? v cc th?c ph?m khc c th? bao g?m c ri, n??c s?t nng, ho?c d?u d?m ?? tr?n salad. Tm t?t  Khi qu v? b? b?nh tro ng??c d? dy th?c qu?n (GERD), cc l?a ch?n th?c ph?m v l?i s?ng c vai tr r?t quan tr?ng ?? gip lm gi?m s? kh ch?u c?a GERD.  ?n cc b?a nh?, th??ng xuyn thay v ba b?a l?n m?i ngy. ?n ch?m ri, trong khng gian th? gin. Trnh g?p ng??i ho?c n?m xu?ng cho ??n khi ?n xong ???c 2-3 gi?.  H?n ch? cc th?c ph?m giu ch?t bo, ch?ng h?n nh? th?t m? ho?c th?c ph?m chin. Thng tin ny khng nh?m m?c ?ch thay th? cho l?i khuyn m chuyn gia ch?m [place] s?c kh?e ni v?i qu v?. Hy b?o ??m qu v? ph?i th?o lu?n b?t k? v?n ?? g m qu v? c v?i chuyn gia ch?m [place] s?c kh?e c?a qu v?. Document Released: 03/19/2017 Document Revised: 03/19/2017 Elsevier Interactive Patient Education  [employer].

## 2018-08-26 ENCOUNTER — Other Ambulatory Visit: Payer: Self-pay | Admitting: Gastroenterology

## 2018-08-26 DIAGNOSIS — A048 Other specified bacterial intestinal infections: Secondary | ICD-10-CM | POA: Diagnosis not present

## 2018-08-28 LAB — H. PYLORI BREATH TEST: H pylori Breath Test: POSITIVE — AB

## 2018-08-30 ENCOUNTER — Other Ambulatory Visit: Payer: Self-pay

## 2018-08-30 MED ORDER — LEVOFLOXACIN 500 MG PO TABS: 500.0000 mg | ORAL_TABLET | Freq: Every day | ORAL | 0 refills | Status: DC

## 2018-08-30 MED ORDER — AMOXICILLIN 500 MG PO TABS: 1000.0000 mg | ORAL_TABLET | Freq: Two times a day (BID) | ORAL | 0 refills | Status: AC

## 2018-08-30 MED ORDER — OMEPRAZOLE 40 MG PO CPDR: 40.0000 mg | DELAYED_RELEASE_CAPSULE | Freq: Every day | ORAL | 0 refills | Status: DC

## 2018-09-21 ENCOUNTER — Other Ambulatory Visit: Payer: Self-pay | Admitting: Internal Medicine

## 2018-09-21 NOTE — Telephone Encounter (Signed)
Pt came in for Office Visit on 08-23-18.

## 2018-09-23 ENCOUNTER — Encounter: Payer: BLUE CROSS/BLUE SHIELD | Admitting: Internal Medicine

## 2018-09-26 ENCOUNTER — Encounter: Payer: BLUE CROSS/BLUE SHIELD | Admitting: Internal Medicine

## 2018-09-30 ENCOUNTER — Other Ambulatory Visit (INDEPENDENT_AMBULATORY_CARE_PROVIDER_SITE_OTHER): Payer: BLUE CROSS/BLUE SHIELD

## 2018-09-30 ENCOUNTER — Ambulatory Visit (INDEPENDENT_AMBULATORY_CARE_PROVIDER_SITE_OTHER): Payer: BLUE CROSS/BLUE SHIELD | Admitting: Internal Medicine

## 2018-09-30 ENCOUNTER — Encounter: Payer: Self-pay | Admitting: Internal Medicine

## 2018-09-30 VITALS — BP 116/78 | HR 94 | Temp 98.3°F | Ht 62.5 in | Wt 132.0 lb

## 2018-09-30 DIAGNOSIS — R7302 Impaired glucose tolerance (oral): Secondary | ICD-10-CM | POA: Diagnosis not present

## 2018-09-30 DIAGNOSIS — Z0000 Encounter for general adult medical examination without abnormal findings: Secondary | ICD-10-CM

## 2018-09-30 DIAGNOSIS — E785 Hyperlipidemia, unspecified: Secondary | ICD-10-CM | POA: Diagnosis not present

## 2018-09-30 LAB — LIPID PANEL
CHOL/HDL RATIO: 6
Cholesterol: 216 mg/dL — ABNORMAL HIGH (ref 0–200)
HDL: 38.6 mg/dL — AB (ref >39.00)
LDL CALC: 143 mg/dL — AB (ref 0–99)
NONHDL: 177.08
TRIGLYCERIDES: 168 mg/dL — AB (ref 0.0–149.0)
VLDL: 33.6 mg/dL (ref 0.0–40.0)

## 2018-09-30 LAB — CBC WITH DIFFERENTIAL/PLATELET
Basophils Absolute: 0 10*3/uL (ref 0.0–0.1)
Basophils Relative: 0.2 % (ref 0.0–3.0)
EOS PCT: 3.6 % (ref 0.0–5.0)
Eosinophils Absolute: 0.3 10*3/uL (ref 0.0–0.7)
HCT: 47.5 % (ref 39.0–52.0)
Hemoglobin: 15.7 g/dL (ref 13.0–17.0)
Lymphocytes Relative: 30.5 % (ref 12.0–46.0)
Lymphs Abs: 2.3 10*3/uL (ref 0.7–4.0)
MCHC: 33.1 g/dL (ref 30.0–36.0)
MCV: 91.1 fl (ref 78.0–100.0)
MONO ABS: 0.6 10*3/uL (ref 0.1–1.0)
Monocytes Relative: 8 % (ref 3.0–12.0)
NEUTROS PCT: 57.7 % (ref 43.0–77.0)
Neutro Abs: 4.4 10*3/uL (ref 1.4–7.7)
PLATELETS: 319 10*3/uL (ref 150.0–400.0)
RBC: 5.22 Mil/uL (ref 4.22–5.81)
RDW: 13.4 % (ref 11.5–15.5)
WBC: 7.7 10*3/uL (ref 4.0–10.5)

## 2018-09-30 LAB — BASIC METABOLIC PANEL
BUN: 13 mg/dL (ref 6–23)
CALCIUM: 9.3 mg/dL (ref 8.4–10.5)
CHLORIDE: 100 meq/L (ref 96–112)
CO2: 30 meq/L (ref 19–32)
Creatinine, Ser: 0.81 mg/dL (ref 0.40–1.50)
GFR: 107.05 mL/min (ref >60.00)
GLUCOSE: 92 mg/dL (ref 70–99)
POTASSIUM: 4.4 meq/L (ref 3.5–5.1)
SODIUM: 139 meq/L (ref 135–145)

## 2018-09-30 LAB — HEPATIC FUNCTION PANEL
ALBUMIN: 4.8 g/dL (ref 3.5–5.2)
ALT: 18 U/L (ref 0–53)
AST: 14 U/L (ref 0–37)
Alkaline Phosphatase: 69 U/L (ref 39–117)
BILIRUBIN TOTAL: 0.6 mg/dL (ref 0.2–1.2)
Bilirubin, Direct: 0.1 mg/dL (ref 0.0–0.3)
Total Protein: 7.8 g/dL (ref 6.0–8.3)

## 2018-09-30 LAB — URINALYSIS, ROUTINE W REFLEX MICROSCOPIC
Bilirubin Urine: NEGATIVE
Hgb urine dipstick: NEGATIVE
KETONES UR: NEGATIVE
Leukocytes, UA: NEGATIVE
NITRITE: NEGATIVE
RBC / HPF: NONE SEEN (ref 0–?)
SPECIFIC GRAVITY, URINE: 1.01 (ref 1.000–1.030)
Total Protein, Urine: NEGATIVE
Urine Glucose: NEGATIVE
Urobilinogen, UA: 0.2 (ref 0.0–1.0)
pH: 6.5 (ref 5.0–8.0)

## 2018-09-30 LAB — HEMOGLOBIN A1C: Hgb A1c MFr Bld: 6 % (ref 4.6–6.5)

## 2018-09-30 LAB — TSH: TSH: 3.06 u[IU]/mL (ref 0.35–4.50)

## 2018-09-30 LAB — PSA: PSA: 0.52 ng/mL (ref 0.10–4.00)

## 2018-09-30 MED ORDER — PRAVASTATIN SODIUM 40 MG PO TABS: ORAL_TABLET | ORAL | 3 refills | Status: DC

## 2018-09-30 MED ORDER — LISINOPRIL 10 MG PO TABS: ORAL_TABLET | ORAL | 3 refills | Status: DC

## 2018-09-30 NOTE — Progress Notes (Signed)
Subjective:    Patient ID: Roger Long, male    DOB: 1966-04-21, 52 y.o.   MRN: [id number]  HPI  Here for wellness and f/u;  Overall doing ok;  Pt denies Chest pain, worsening SOB, DOE, wheezing, orthopnea, PND, worsening LE edema, palpitations, dizziness or syncope.  Pt denies neurological change such as new headache, facial or extremity weakness.  Pt denies polydipsia, polyuria, or low sugar symptoms. Pt states overall good compliance with treatment and medications, good tolerability, and has been trying to follow appropriate diet.  Pt denies worsening depressive symptoms, suicidal ideation or panic. No fever, night sweats, wt loss, loss of appetite, or other constitutional symptoms.  Pt states good ability with ADL's, has low fall risk, home safety reviewed and adequate, no other significant changes in hearing or vision, and only occasionally active with exercise. No new complaints Past Medical History:  Diagnosis Date  . ALLERGIC RHINITIS 07/19/2009  . BACK PAIN 07/19/2009  . GERD 08/06/2010  . H. pylori infection   . Headache(784.0) 08/06/2010  . HYPERLIPIDEMIA 08/06/2010  . Hypertension   . Impaired glucose tolerance 09/26/2011  . PLANTAR FASCIITIS 07/19/2009   Past Surgical History:  Procedure Laterality Date  . WISDOM TOOTH EXTRACTION      reports that he has quit smoking. He has never used smokeless tobacco. He reports that he drinks alcohol. He reports that he does not use drugs. family history includes Hypertension in his mother; Stroke in his mother. No Known Allergies Current Outpatient Medications on File Prior to Visit  Medication Sig Dispense Refill  . cetirizine (ZYRTEC) 10 MG tablet Take 1 tablet (10 mg total) by mouth daily. 90 tablet 3  . levocetirizine (XYZAL) 5 MG tablet Take 1 tablet (5 mg total) by mouth daily. 30 tablet 0  . levofloxacin (LEVAQUIN) 500 MG tablet Take 1 tablet (500 mg total) by mouth daily. 14 tablet 0  . Multiple Vitamins-Minerals (MULTIVITAMIN ADULT PO)  Take 1 tablet by mouth daily.    Marland Kitchen omeprazole (PRILOSEC) 40 MG capsule Take 1 capsule (40 mg total) by mouth daily. 14 capsule 0   No current facility-administered medications on file prior to visit.    Review of Systems  Constitutional: Negative for other unusual diaphoresis or sweats HENT: Negative for ear discharge or swelling Eyes: Negative for other worsening visual disturbances Respiratory: Negative for stridor or other swelling  Gastrointestinal: Negative for worsening distension or other blood Genitourinary: Negative for retention or other urinary change Musculoskeletal: Negative for other MSK pain or swelling Skin: Negative for color change or other new lesions Neurological: Negative for worsening tremors and other numbness  Psychiatric/Behavioral: Negative for worsening agitation or other fatigue All other system neg per pt    Objective:   Physical Exam BP 116/78   Pulse 94   Temp 98.3 F (36.8 C) (Oral)   Ht 5' 2.5" (1.588 m)   Wt 132 lb (59.9 kg)   SpO2 97%   BMI 23.76 kg/m  VS noted,  Constitutional: Pt is oriented to person, place, and time. Appears well-developed and well-nourished, in no significant distress and comfortable Head: Normocephalic and atraumatic  Eyes: Conjunctivae and EOM are normal. Pupils are equal, round, and reactive to light Right Ear: External ear normal without discharge Left Ear: External ear normal without discharge Nose: Nose without discharge or deformity Mouth/Throat: Oropharynx is without other ulcerations and moist  Neck: Normal range of motion. Neck supple. No JVD present. No tracheal deviation present or significant neck LA or  mass Cardiovascular: Normal rate, regular rhythm, normal heart sounds and intact distal pulses.   Pulmonary/Chest: WOB normal and breath sounds without rales or wheezing  Abdominal: Soft. Bowel sounds are normal. NT. No HSM  Musculoskeletal: Normal range of motion. Exhibits no edema Lymphadenopathy: Has no  other cervical adenopathy.  Neurological: Pt is alert and oriented to person, place, and time. Pt has normal reflexes. No cranial nerve deficit. Motor grossly intact, Gait intact Skin: Skin is warm and dry. No rash noted or new ulcerations Psychiatric:  Has normal mood and affect. Behavior is normal without agitation No other exam findings Lab Results  Component Value Date   WBC 7.7 09/30/2018   HGB 15.7 09/30/2018   HCT 47.5 09/30/2018   PLT 319.0 09/30/2018   GLUCOSE 92 09/30/2018   CHOL 216 (H) 09/30/2018   TRIG 168.0 (H) 09/30/2018   HDL 38.60 (L) 09/30/2018   LDLDIRECT 142.0 08/29/2015   LDLCALC 143 (H) 09/30/2018   ALT 18 09/30/2018   AST 14 09/30/2018   NA 139 09/30/2018   K 4.4 09/30/2018   CL 100 09/30/2018   CREATININE 0.81 09/30/2018   BUN 13 09/30/2018   CO2 30 09/30/2018   TSH 3.06 09/30/2018   PSA 0.52 09/30/2018   HGBA1C 6.0 09/30/2018        Assessment & Plan:

## 2018-09-30 NOTE — Patient Instructions (Signed)

## 2018-10-01 NOTE — Assessment & Plan Note (Signed)
stable overall by history and exam, recent data reviewed with pt, and pt to continue medical treatment as before,  to f/u any worsening symptoms or concerns, for a1c with next labs 

## 2018-10-01 NOTE — Assessment & Plan Note (Signed)
Mild elevated o/w stable overall by history and exam, recent data reviewed with pt, and pt to continue medical treatment as before,  to f/u any worsening symptoms or concerns, declines statin, for lower chol diet

## 2018-10-01 NOTE — Assessment & Plan Note (Signed)

## 2018-10-07 ENCOUNTER — Other Ambulatory Visit: Payer: Self-pay | Admitting: Gastroenterology

## 2018-10-07 DIAGNOSIS — A048 Other specified bacterial intestinal infections: Secondary | ICD-10-CM | POA: Diagnosis not present

## 2018-10-09 LAB — H. PYLORI BREATH TEST: H pylori Breath Test: NEGATIVE

## 2019-10-06 ENCOUNTER — Encounter: Payer: Self-pay | Admitting: Internal Medicine

## 2019-10-06 ENCOUNTER — Ambulatory Visit (INDEPENDENT_AMBULATORY_CARE_PROVIDER_SITE_OTHER): Payer: BC Managed Care – PPO | Admitting: Internal Medicine

## 2019-10-06 ENCOUNTER — Other Ambulatory Visit (INDEPENDENT_AMBULATORY_CARE_PROVIDER_SITE_OTHER): Payer: BC Managed Care – PPO

## 2019-10-06 ENCOUNTER — Other Ambulatory Visit: Payer: Self-pay

## 2019-10-06 VITALS — BP 142/86 | HR 95 | Temp 98.7°F | Ht 65.5 in | Wt 141.0 lb

## 2019-10-06 DIAGNOSIS — N50811 Right testicular pain: Secondary | ICD-10-CM

## 2019-10-06 DIAGNOSIS — Z0001 Encounter for general adult medical examination with abnormal findings: Secondary | ICD-10-CM

## 2019-10-06 DIAGNOSIS — R7302 Impaired glucose tolerance (oral): Secondary | ICD-10-CM

## 2019-10-06 DIAGNOSIS — M79644 Pain in right finger(s): Secondary | ICD-10-CM | POA: Diagnosis not present

## 2019-10-06 DIAGNOSIS — N50819 Testicular pain, unspecified: Secondary | ICD-10-CM | POA: Insufficient documentation

## 2019-10-06 DIAGNOSIS — N50812 Left testicular pain: Secondary | ICD-10-CM

## 2019-10-06 DIAGNOSIS — Z0000 Encounter for general adult medical examination without abnormal findings: Secondary | ICD-10-CM

## 2019-10-06 DIAGNOSIS — Z23 Encounter for immunization: Secondary | ICD-10-CM | POA: Diagnosis not present

## 2019-10-06 DIAGNOSIS — E785 Hyperlipidemia, unspecified: Secondary | ICD-10-CM | POA: Diagnosis not present

## 2019-10-06 DIAGNOSIS — I10 Essential (primary) hypertension: Secondary | ICD-10-CM | POA: Insufficient documentation

## 2019-10-06 HISTORY — DX: Essential (primary) hypertension: I10

## 2019-10-06 LAB — URINALYSIS, ROUTINE W REFLEX MICROSCOPIC
Bilirubin Urine: NEGATIVE
Hgb urine dipstick: NEGATIVE
Ketones, ur: NEGATIVE
Leukocytes,Ua: NEGATIVE
Nitrite: NEGATIVE
RBC / HPF: NONE SEEN (ref 0–?)
Specific Gravity, Urine: 1.02 (ref 1.000–1.030)
Total Protein, Urine: NEGATIVE
Urine Glucose: NEGATIVE
Urobilinogen, UA: 0.2 (ref 0.0–1.0)
pH: 6.5 (ref 5.0–8.0)

## 2019-10-06 LAB — BASIC METABOLIC PANEL WITH GFR
BUN: 17 mg/dL (ref 6–23)
CO2: 29 meq/L (ref 19–32)
Calcium: 9 mg/dL (ref 8.4–10.5)
Chloride: 102 meq/L (ref 96–112)
Creatinine, Ser: 0.7 mg/dL (ref 0.40–1.50)
GFR: 118.72 mL/min
Glucose, Bld: 113 mg/dL — ABNORMAL HIGH (ref 70–99)
Potassium: 4 meq/L (ref 3.5–5.1)
Sodium: 139 meq/L (ref 135–145)

## 2019-10-06 LAB — PSA: PSA: 0.34 ng/mL (ref 0.10–4.00)

## 2019-10-06 LAB — CBC WITH DIFFERENTIAL/PLATELET
Basophils Absolute: 0 10*3/uL (ref 0.0–0.1)
Basophils Relative: 0.4 % (ref 0.0–3.0)
Eosinophils Absolute: 0.4 10*3/uL (ref 0.0–0.7)
Eosinophils Relative: 4 % (ref 0.0–5.0)
HCT: 42.8 % (ref 39.0–52.0)
Hemoglobin: 14.2 g/dL (ref 13.0–17.0)
Lymphocytes Relative: 29.4 % (ref 12.0–46.0)
Lymphs Abs: 2.8 10*3/uL (ref 0.7–4.0)
MCHC: 33.2 g/dL (ref 30.0–36.0)
MCV: 92.2 fl (ref 78.0–100.0)
Monocytes Absolute: 0.7 10*3/uL (ref 0.1–1.0)
Monocytes Relative: 7.4 % (ref 3.0–12.0)
Neutro Abs: 5.6 10*3/uL (ref 1.4–7.7)
Neutrophils Relative %: 58.8 % (ref 43.0–77.0)
Platelets: 305 10*3/uL (ref 150.0–400.0)
RBC: 4.65 Mil/uL (ref 4.22–5.81)
RDW: 13.3 % (ref 11.5–15.5)
WBC: 9.5 10*3/uL (ref 4.0–10.5)

## 2019-10-06 LAB — LIPID PANEL
Cholesterol: 172 mg/dL (ref 0–200)
HDL: 39.6 mg/dL
NonHDL: 132.84
Total CHOL/HDL Ratio: 4
Triglycerides: 253 mg/dL — ABNORMAL HIGH (ref 0.0–149.0)
VLDL: 50.6 mg/dL — ABNORMAL HIGH (ref 0.0–40.0)

## 2019-10-06 LAB — HEPATIC FUNCTION PANEL
ALT: 22 U/L (ref 0–53)
AST: 16 U/L (ref 0–37)
Albumin: 4.5 g/dL (ref 3.5–5.2)
Alkaline Phosphatase: 68 U/L (ref 39–117)
Bilirubin, Direct: 0.1 mg/dL (ref 0.0–0.3)
Total Bilirubin: 0.4 mg/dL (ref 0.2–1.2)
Total Protein: 7 g/dL (ref 6.0–8.3)

## 2019-10-06 LAB — TSH: TSH: 2.91 u[IU]/mL (ref 0.35–4.50)

## 2019-10-06 LAB — HEMOGLOBIN A1C: Hgb A1c MFr Bld: 6 % (ref 4.6–6.5)

## 2019-10-06 LAB — LDL CHOLESTEROL, DIRECT: Direct LDL: 98 mg/dL

## 2019-10-06 MED ORDER — PRAVASTATIN SODIUM 40 MG PO TABS: ORAL_TABLET | ORAL | 3 refills | Status: DC

## 2019-10-06 MED ORDER — LISINOPRIL 10 MG PO TABS: ORAL_TABLET | ORAL | 3 refills | Status: DC

## 2019-10-06 NOTE — Assessment & Plan Note (Addendum)
Mild, intermittent, only with increased walking during his 10 hr work days, exam benign, for UA but ok for tylenol prn  In addition to the time spent performing CPE, I spent an additional 20 minutes face to face,in which greater than 50% of this time was spent in counseling and coordination of care for patient's illness as documented, including the differential dx, treatment, further evaluation and other management of testicle pain, HTn, hyperglycemia, HLD, right thumb pain

## 2019-10-06 NOTE — Assessment & Plan Note (Signed)
For a1c with labs

## 2019-10-06 NOTE — Addendum Note (Signed)
Addended by: Cresenciano Lick on: 10/06/2019 02:09 PM   Modules accepted: Orders

## 2019-10-06 NOTE — Assessment & Plan Note (Signed)

## 2019-10-06 NOTE — Progress Notes (Signed)
Subjective:    Patient ID: Roger Long, male    DOB: Sep 21, 1966, 53 y.o.   MRN: [id number]  HPI  Here for wellness and f/u;  Overall doing ok;  Pt denies Chest pain, worsening SOB, DOE, wheezing, orthopnea, PND, worsening LE edema, palpitations, dizziness or syncope.  Pt denies neurological change such as new headache, facial or extremity weakness.  Pt denies polydipsia, polyuria, or low sugar symptoms. Pt states overall good compliance with treatment and medications, good tolerability, and has been trying to follow appropriate diet.  Pt denies worsening depressive symptoms, suicidal ideation or panic. No fever, night sweats, wt loss, loss of appetite, or other constitutional symptoms.  Pt states good ability with ADL's, has low fall risk, home safety reviewed and adequate, no other significant changes in hearing or vision, and only occasionally active with exercise. Sons has not symptoms of COVID but he was called today due to co-worker exposure.  Pt and son live in the same home but completely different parts of the home and different schedule of work. Has run out of pravachol - wants to restart, also out of zestril x 2 days. Also, Denies urinary symptoms such as dysuria, frequency, urgency, flank pain, hematuria or n/v, fever, chills but has bilateral testicle drawing up and aching with increased walking for 10 hrs per day, none when does not have to walk so much Also with pain and hard swelling to the DIP of the right thumb , mild to mod, intermittent, worse to flex, better not to move for several months Past Medical History:  Diagnosis Date  . ALLERGIC RHINITIS 07/19/2009  . BACK PAIN 07/19/2009  . GERD 08/06/2010  . H. pylori infection   . Headache(784.0) 08/06/2010  . HYPERLIPIDEMIA 08/06/2010  . Hypertension   . Impaired glucose tolerance 09/26/2011  . PLANTAR FASCIITIS 07/19/2009   Past Surgical History:  Procedure Laterality Date  . WISDOM TOOTH EXTRACTION      reports that he has quit  smoking. He has never used smokeless tobacco. He reports current alcohol use. He reports that he does not use drugs. family history includes Hypertension in his mother; Stroke in his mother. No Known Allergies Current Outpatient Medications on File Prior to Visit  Medication Sig Dispense Refill  . cetirizine (ZYRTEC) 10 MG tablet Take 1 tablet (10 mg total) by mouth daily. 90 tablet 3  . lisinopril (PRINIVIL,ZESTRIL) 10 MG tablet TAKE 1 TABLET(10 MG) BY MOUTH DAILY 90 tablet 3  . Multiple Vitamins-Minerals (MULTIVITAMIN ADULT PO) Take 1 tablet by mouth daily.    . pravastatin (PRAVACHOL) 40 MG tablet TAKE 1 TABLET(40 MG) BY MOUTH DAILY 90 tablet 3   No current facility-administered medications on file prior to visit.    Review of Systems Constitutional: Negative for other unusual diaphoresis, sweats, appetite or weight changes HENT: Negative for other worsening hearing loss, ear pain, facial swelling, mouth sores or neck stiffness.   Eyes: Negative for other worsening pain, redness or other visual disturbance.  Respiratory: Negative for other stridor or swelling Cardiovascular: Negative for other palpitations or other chest pain  Gastrointestinal: Negative for worsening diarrhea or loose stools, blood in stool, distention or other pain Genitourinary: Negative for hematuria, flank pain or other change in urine volume.  Musculoskeletal: Negative for myalgias or other joint swelling.  Skin: Negative for other color change, or other wound or worsening drainage.  Neurological: Negative for other syncope or numbness. Hematological: Negative for other adenopathy or swelling Psychiatric/Behavioral: Negative for hallucinations, other worsening  agitation, SI, self-injury, or new decreased concentration All otherwise neg per pt     Objective:   Physical Exam BP (!) 142/86 (BP Location: Left Arm, Patient Position: Sitting, Cuff Size: Normal)   Pulse 95   Temp 98.7 F (37.1 C) (Oral)   Ht 5'  5.5" (1.664 m)   Wt 141 lb (64 kg)   SpO2 96%   BMI 23.11 kg/m  VS noted,  Constitutional: Pt is oriented to person, place, and time. Appears well-developed and well-nourished, in no significant distress and comfortable Head: Normocephalic and atraumatic  Eyes: Conjunctivae and EOM are normal. Pupils are equal, round, and reactive to light Right Ear: External ear normal without discharge Left Ear: External ear normal without discharge Nose: Nose without discharge or deformity Mouth/Throat: Oropharynx is without other ulcerations and moist  Neck: Normal range of motion. Neck supple. No JVD present. No tracheal deviation present or significant neck LA or mass Cardiovascular: Normal rate, regular rhythm, normal heart sounds and intact distal pulses.   Pulmonary/Chest: WOB normal and breath sounds without rales or wheezing  Abdominal: Soft. Bowel sounds are normal. NT. No HSM  G: normal male ext genital and scrotal contents Musculoskeletal: Normal range of motion. Exhibits no edema, right thumb DIP with mild OA degenerative changes Lymphadenopathy: Has no other cervical adenopathy.  Neurological: Pt is alert and oriented to person, place, and time. Pt has normal reflexes. No cranial nerve deficit. Motor grossly intact, Gait intact Skin: Skin is warm and dry. No rash noted or new ulcerations Psychiatric:  Has normal mood and affect. Behavior is normal without agitation All otherwise neg per pt Lab Results  Component Value Date   WBC 7.7 09/30/2018   HGB 15.7 09/30/2018   HCT 47.5 09/30/2018   PLT 319.0 09/30/2018   GLUCOSE 92 09/30/2018   CHOL 216 (H) 09/30/2018   TRIG 168.0 (H) 09/30/2018   HDL 38.60 (L) 09/30/2018   LDLDIRECT 142.0 08/29/2015   LDLCALC 143 (H) 09/30/2018   ALT 18 09/30/2018   AST 14 09/30/2018   NA 139 09/30/2018   K 4.4 09/30/2018   CL 100 09/30/2018   CREATININE 0.81 09/30/2018   BUN 13 09/30/2018   CO2 30 09/30/2018   TSH 3.06 09/30/2018   PSA 0.52  09/30/2018   HGBA1C 6.0 09/30/2018       Assessment & Plan:

## 2019-10-06 NOTE — Assessment & Plan Note (Signed)
This is c/w pain to OA joint - for volt gel prn,  to f/u any worsening symptoms or concerns

## 2019-10-06 NOTE — Assessment & Plan Note (Signed)
Mild increased, to restart ACE

## 2019-10-06 NOTE — Assessment & Plan Note (Signed)
To restart statin.

## 2019-10-06 NOTE — Patient Instructions (Addendum)
You had the Tdap tetanus shot today  OK to try OTC Voltaren gel topical for the thumb pain  Please continue all other medications as before, and refills have been done if requested.  Please have the pharmacy call with any other refills you may need.  Please continue your efforts at being more active, low cholesterol diet, and weight control.  You are otherwise up to date with prevention measures today.  Please keep your appointments with your specialists as you may have planned  Please go to the LAB in the Basement (turn left off the elevator) for the tests to be done today  You will be contacted by phone if any changes need to be made immediately.  Otherwise, you will receive a letter about your results with an explanation, but please check with MyChart first.  Please remember to sign up for MyChart if you have not done so, as this will be important to you in the future with finding out test results, communicating by private email, and scheduling acute appointments online when needed.  Please return in 1 year for your yearly visit, or sooner if needed, with Lab testing done 3-5 days before

## 2019-11-14 ENCOUNTER — Other Ambulatory Visit: Payer: Self-pay | Admitting: Cardiology

## 2019-11-14 DIAGNOSIS — Z20822 Contact with and (suspected) exposure to covid-19: Secondary | ICD-10-CM

## 2019-11-16 LAB — NOVEL CORONAVIRUS, NAA: SARS-CoV-2, NAA: NOT DETECTED

## 2020-01-12 ENCOUNTER — Ambulatory Visit (INDEPENDENT_AMBULATORY_CARE_PROVIDER_SITE_OTHER): Payer: BC Managed Care – PPO | Admitting: Internal Medicine

## 2020-01-12 ENCOUNTER — Encounter: Payer: Self-pay | Admitting: Internal Medicine

## 2020-01-12 DIAGNOSIS — R7302 Impaired glucose tolerance (oral): Secondary | ICD-10-CM

## 2020-01-12 DIAGNOSIS — I10 Essential (primary) hypertension: Secondary | ICD-10-CM | POA: Diagnosis not present

## 2020-01-12 DIAGNOSIS — M542 Cervicalgia: Secondary | ICD-10-CM

## 2020-01-12 MED ORDER — TRAMADOL HCL 50 MG PO TABS: 50.0000 mg | ORAL_TABLET | Freq: Four times a day (QID) | ORAL | 0 refills | Status: DC | PRN

## 2020-01-12 MED ORDER — PREDNISONE 10 MG PO TABS: ORAL_TABLET | ORAL | 0 refills | Status: DC

## 2020-01-12 MED ORDER — CYCLOBENZAPRINE HCL 5 MG PO TABS: 5.0000 mg | ORAL_TABLET | Freq: Three times a day (TID) | ORAL | 1 refills | Status: DC | PRN

## 2020-01-12 NOTE — Patient Instructions (Signed)
Please take all new medication as prescribed 

## 2020-01-12 NOTE — Assessment & Plan Note (Signed)
stable overall by history and exam, recent data reviewed with pt, and pt to continue medical treatment as before,  to f/u any worsening symptoms or concerns  

## 2020-01-12 NOTE — Assessment & Plan Note (Signed)
Encouraged to f/u BP at home and next visit 

## 2020-01-12 NOTE — Progress Notes (Signed)
Patient ID: Roger Long, male   DOB: 12-07-1966, 54 y.o.   MRN: [id number]  Virtual Visit via Video Note  I connected with Roger Long on 01/12/20 at 11:00 AM EST by a video enabled telemedicine application and verified that I am speaking with the correct person using two identifiers.  Location: Patient: at home Provider: at office   I discussed the limitations of evaluation and management by telemedicine and the availability of in person appointments. The patient expressed understanding and agreed to proceed.  History of Present Illness: Here with poor video connection with delays and signifcant accented speech; I believe he c/o post neck pain most days for 2-3 wks, seems to be worse by about 4 pm most days then somewhat better by the next AM, then to start again later in the day next day again; OTC med not helping, worse to move the head flexion/extension and horizontal. Pt denies new neurological symptoms such as new headache, or facial or extremity weakness or numbness Past Medical History:  Diagnosis Date  . ALLERGIC RHINITIS 07/19/2009  . BACK PAIN 07/19/2009  . GERD 08/06/2010  . H. pylori infection   . Headache(784.0) 08/06/2010  . HTN (hypertension) 10/06/2019  . HYPERLIPIDEMIA 08/06/2010  . Hypertension   . Impaired glucose tolerance 09/26/2011  . PLANTAR FASCIITIS 07/19/2009   Past Surgical History:  Procedure Laterality Date  . WISDOM TOOTH EXTRACTION      reports that he has quit smoking. He has never used smokeless tobacco. He reports current alcohol use. He reports that he does not use drugs. family history includes Hypertension in his mother; Stroke in his mother. No Known Allergies Current Outpatient Medications on File Prior to Visit  Medication Sig Dispense Refill  . cetirizine (ZYRTEC) 10 MG tablet Take 1 tablet (10 mg total) by mouth daily. 90 tablet 3  . lisinopril (ZESTRIL) 10 MG tablet TAKE 1 TABLET(10 MG) BY MOUTH DAILY 90 tablet 3  . Multiple Vitamins-Minerals  (MULTIVITAMIN ADULT PO) Take 1 tablet by mouth daily.    . pravastatin (PRAVACHOL) 40 MG tablet TAKE 1 TABLET(40 MG) BY MOUTH DAILY 90 tablet 3   No current facility-administered medications on file prior to visit.    Observations/Objective: Alert, NAD, appropriate mood and affect, resps normal, cn 2-12 intact, moves all 4s, no visible rash or swelling Lab Results  Component Value Date   WBC 9.5 10/06/2019   HGB 14.2 10/06/2019   HCT 42.8 10/06/2019   PLT 305.0 10/06/2019   GLUCOSE 113 (H) 10/06/2019   CHOL 172 10/06/2019   TRIG 253.0 (H) 10/06/2019   HDL 39.60 10/06/2019   LDLDIRECT 98.0 10/06/2019   LDLCALC 143 (H) 09/30/2018   ALT 22 10/06/2019   AST 16 10/06/2019   NA 139 10/06/2019   K 4.0 10/06/2019   CL 102 10/06/2019   CREATININE 0.70 10/06/2019   BUN 17 10/06/2019   CO2 29 10/06/2019   TSH 2.91 10/06/2019   PSA 0.34 10/06/2019   HGBA1C 6.0 10/06/2019   Assessment and Plan: See notes  Follow Up Instructions: See notes   I discussed the assessment and treatment plan with the patient. The patient was provided an opportunity to ask questions and all were answered. The patient agreed with the plan and demonstrated an understanding of the instructions.   The patient was advised to call back or seek an in-person evaluation if the symptoms worsen or if the condition fails to improve as anticipated.   Cathlean Cower, MD

## 2020-01-12 NOTE — Assessment & Plan Note (Addendum)
Likely c/w cervical djd/ddd - for tramadol prn, flexeril prn, and predpac asd,  to f/u any worsening symptoms or concerns  I spent 30 minutes preparing to see the patient by review of recent labs, imaging and procedures, obtaining and reviewing separately obtained history, communicating with the patient and family or caregiver, ordering medications, tests or procedures, and documenting clinical information in the EHR including the differential Dx, treatment, and any further evaluation and other management of post neck pain, hypergycemia, HTN

## 2020-01-30 ENCOUNTER — Telehealth: Payer: BC Managed Care – PPO | Admitting: Emergency Medicine

## 2020-01-30 DIAGNOSIS — M791 Myalgia, unspecified site: Secondary | ICD-10-CM | POA: Diagnosis not present

## 2020-01-30 DIAGNOSIS — R509 Fever, unspecified: Secondary | ICD-10-CM

## 2020-01-30 NOTE — Progress Notes (Signed)
We are sorry you are not feeling well.  Here is how we plan to help!  Based on what you have shared with me, it looks like you may have a viral infection.    With the recent cold symptoms that you describe, it is likely that your symptoms are caused by a virus.  Viruses do not respond to antibiotics, and generally just take time to get better.  You should take Tylenol or Motrin for fever and body aches.  We haven't seen much flu yet this year.  I think that your symptoms are less likely to be flu.  It is possible that your symptoms are COVID-19.  If you have not been tested since getting sick, you should be tested now.  If your test is positive, you will need to quarantine for 14 days.  If your fever does not improve, or if you have abdominal pain, productive cough, or worsening symptoms, you should be seen in-person.  You can arrange a visit with your doctor or with an urgent care.  Symptoms vary from person to person, with common symptoms including sore throat, cough, fatigue or lack of energy and feeling of general discomfort.  A low-grade fever of up to 100.4 may present, but is often uncommon.  Symptoms vary however, and are closely related to a person's age or underlying illnesses.  The most common symptoms associated with an upper respiratory infection are nasal discharge or congestion, cough, sneezing, headache and pressure in the ears and face.  These symptoms usually persist for about 3 to 10 days, but can last up to 2 weeks.  It is important to know that upper respiratory infections do not cause serious illness or complications in most cases.    Upper respiratory infections can be transmitted from person to person, with the most common method of transmission being a person's hands.  The virus is able to live on the skin and can infect other persons for up to 2 hours after direct contact.  Also, these can be transmitted when someone coughs or sneezes; thus, it is important to cover the mouth to  reduce this risk.  To keep the spread of the illness at [place], good hand hygiene is very important.  This is an infection that is most likely caused by a virus. There are no specific treatments other than to help you with the symptoms until the infection runs its course.  We are sorry you are not feeling well.  Here is how we plan to help!  For now, the best treatment is going to be rest, staying hydrated, and taking over-the-counter cold medicine such as Dayquil or Nyquil.  Be careful not to double up on Tylenol, as these medicines contain Tylenol within them.  For headache, pain or general discomfort, you can use Ibuprofen or Tylenol as directed.   Some authorities believe that zinc sprays or the use of Echinacea may shorten the course of your symptoms.   HOME CARE . Only take medications as instructed by your medical team. . Be sure to drink plenty of fluids. Water is fine as well as fruit juices, sodas and electrolyte beverages. You may want to stay away from caffeine or alcohol. If you are nauseated, try taking small sips of liquids. How do you know if you are getting enough fluid? Your urine should be a pale yellow or almost colorless. . Get rest. . Taking a steamy shower or using a humidifier may help nasal congestion and ease sore throat  pain. You can place a towel over your head and breathe in the steam from hot water coming from a faucet. . Using a saline nasal spray works much the same way. . Cough drops, hard candies and sore throat lozenges may ease your cough. . Avoid close contacts especially the very young and the elderly . Cover your mouth if you cough or sneeze . Always remember to wash your hands.   GET HELP RIGHT AWAY IF: . You develop worsening fever. . If your symptoms do not improve within 10 days . You develop yellow or green discharge from your nose over 3 days. . You have coughing fits . You develop a severe head ache or visual changes. . You develop shortness of  breath, difficulty breathing or start having chest pain . Your symptoms persist after you have completed your treatment plan  MAKE SURE YOU   Understand these instructions.  Will watch your condition.  Will get help right away if you are not doing well or get worse.  Your e-visit answers were reviewed by a board certified advanced clinical practitioner to complete your personal care plan. Depending upon the condition, your plan could have included both over the counter or prescription medications. Please review your pharmacy choice. If there is a problem, you may call our nursing hot line at and have the prescription routed to another pharmacy. Your safety is important to [place]. If you have drug allergies check your prescription carefully.   You can use MyChart to ask questions about today's visit, request a non-urgent call back, or ask for a work or school excuse for 24 hours related to this e-Visit. If it has been greater than 24 hours you will need to follow up with your provider, or enter a new e-Visit to address those concerns. You will get an e-mail in the next two days asking about your experience.  I hope that your e-visit has been valuable and will speed your recovery. Thank you for using e-visits.   Greater than 5 minutes, yet less than 10 minutes was used in reviewing the patient's chart, questionnaire, prescribing medications, and documentation for this visit.

## 2020-01-31 ENCOUNTER — Ambulatory Visit: Payer: BC Managed Care – PPO | Attending: Internal Medicine

## 2020-01-31 DIAGNOSIS — Z20822 Contact with and (suspected) exposure to covid-19: Secondary | ICD-10-CM

## 2020-02-01 LAB — NOVEL CORONAVIRUS, NAA: SARS-CoV-2, NAA: DETECTED — AB

## 2020-02-02 ENCOUNTER — Telehealth: Payer: Self-pay | Admitting: Adult Health

## 2020-02-02 NOTE — Telephone Encounter (Signed)
Called patient about his COVID positivity and whether or not he met criteria for monoclonal antibody infusions.  He does not meet criteria.  Reviewed supportive care measures and isolation precautions.  He knows to seek immediate medical care if his condition worsens.  Wilber Bihari, NP

## 2020-02-06 ENCOUNTER — Other Ambulatory Visit: Payer: BC Managed Care – PPO

## 2020-04-12 ENCOUNTER — Other Ambulatory Visit: Payer: Self-pay

## 2020-04-12 ENCOUNTER — Ambulatory Visit: Payer: BC Managed Care – PPO | Admitting: Internal Medicine

## 2020-04-12 VITALS — BP 124/82 | HR 90 | Temp 98.3°F | Ht 65.0 in | Wt 137.0 lb

## 2020-04-12 DIAGNOSIS — R109 Unspecified abdominal pain: Secondary | ICD-10-CM | POA: Insufficient documentation

## 2020-04-12 DIAGNOSIS — R1030 Lower abdominal pain, unspecified: Secondary | ICD-10-CM

## 2020-04-12 DIAGNOSIS — I10 Essential (primary) hypertension: Secondary | ICD-10-CM

## 2020-04-12 DIAGNOSIS — R7302 Impaired glucose tolerance (oral): Secondary | ICD-10-CM | POA: Diagnosis not present

## 2020-04-12 LAB — BASIC METABOLIC PANEL
BUN: 19 mg/dL (ref 6–23)
CO2: 31 mEq/L (ref 19–32)
Calcium: 8.8 mg/dL (ref 8.4–10.5)
Chloride: 102 mEq/L (ref 96–112)
Creatinine, Ser: 0.82 mg/dL (ref 0.40–1.50)
GFR: 98.7 mL/min (ref >60.00)
Glucose, Bld: 104 mg/dL — ABNORMAL HIGH (ref 70–99)
Potassium: 4.4 mEq/L (ref 3.5–5.1)
Sodium: 138 mEq/L (ref 135–145)

## 2020-04-12 LAB — HEPATIC FUNCTION PANEL
ALT: 22 U/L (ref 0–53)
AST: 17 U/L (ref 0–37)
Albumin: 4.5 g/dL (ref 3.5–5.2)
Alkaline Phosphatase: 66 U/L (ref 39–117)
Bilirubin, Direct: 0 mg/dL (ref 0.0–0.3)
Total Bilirubin: 0.4 mg/dL (ref 0.2–1.2)
Total Protein: 7.6 g/dL (ref 6.0–8.3)

## 2020-04-12 LAB — CBC WITH DIFFERENTIAL/PLATELET
Basophils Absolute: 0 10*3/uL (ref 0.0–0.1)
Basophils Relative: 0.4 % (ref 0.0–3.0)
Eosinophils Absolute: 0.4 10*3/uL (ref 0.0–0.7)
Eosinophils Relative: 5.4 % — ABNORMAL HIGH (ref 0.0–5.0)
HCT: 44.5 % (ref 39.0–52.0)
Hemoglobin: 14.7 g/dL (ref 13.0–17.0)
Lymphocytes Relative: 35.1 % (ref 12.0–46.0)
Lymphs Abs: 2.7 10*3/uL (ref 0.7–4.0)
MCHC: 33 g/dL (ref 30.0–36.0)
MCV: 91.5 fl (ref 78.0–100.0)
Monocytes Absolute: 0.5 10*3/uL (ref 0.1–1.0)
Monocytes Relative: 6.9 % (ref 3.0–12.0)
Neutro Abs: 4.1 10*3/uL (ref 1.4–7.7)
Neutrophils Relative %: 52.2 % (ref 43.0–77.0)
Platelets: 317 10*3/uL (ref 150.0–400.0)
RBC: 4.86 Mil/uL (ref 4.22–5.81)
RDW: 13.8 % (ref 11.5–15.5)
WBC: 7.8 10*3/uL (ref 4.0–10.5)

## 2020-04-12 LAB — URINALYSIS, ROUTINE W REFLEX MICROSCOPIC
Bilirubin Urine: NEGATIVE
Hgb urine dipstick: NEGATIVE
Ketones, ur: NEGATIVE
Leukocytes,Ua: NEGATIVE
Nitrite: NEGATIVE
RBC / HPF: NONE SEEN (ref 0–?)
Specific Gravity, Urine: 1.025 (ref 1.000–1.030)
Total Protein, Urine: NEGATIVE
Urine Glucose: NEGATIVE
Urobilinogen, UA: 0.2 (ref 0.0–1.0)
pH: 6.5 (ref 5.0–8.0)

## 2020-04-12 LAB — LIPASE: Lipase: 16 U/L (ref 11.0–59.0)

## 2020-04-12 MED ORDER — DOXYCYCLINE HYCLATE 100 MG PO TABS: 100.0000 mg | ORAL_TABLET | Freq: Two times a day (BID) | ORAL | 0 refills | Status: DC

## 2020-04-12 NOTE — Progress Notes (Addendum)
Subjective:    Patient ID: Roger Long, male    DOB: 10/23/1966, 54 y.o.   MRN: [id number]  HPI  Here with c/o 1 wk onset low mid abd pain/pelvic pain, sharp at times and dull, mostly moderate sometimes severe, sometimes pressure and crampy, intermittent, no radiation, overall better today for no apparent reason, associated with urinary weak stream and slight dysuria, without fever, n/v and Denies urinary symptoms such as frequency, urgency, flank pain, hematuria or chills except for mild nausea for a short time yesterday resolved.  States has some / harder stools in that it is more difficult to start elimination but o/w seems normal stool.  Denies worsening reflux, dysphagia, diarrhea or blood. Past Medical History:  Diagnosis Date  . ALLERGIC RHINITIS 07/19/2009  . BACK PAIN 07/19/2009  . GERD 08/06/2010  . H. pylori infection   . Headache(784.0) 08/06/2010  . HTN (hypertension) 10/06/2019  . HYPERLIPIDEMIA 08/06/2010  . Hypertension   . Impaired glucose tolerance 09/26/2011  . PLANTAR FASCIITIS 07/19/2009   Past Surgical History:  Procedure Laterality Date  . WISDOM TOOTH EXTRACTION      reports that he has quit smoking. He has never used smokeless tobacco. He reports current alcohol use. He reports that he does not use drugs. family history includes Hypertension in his mother; Stroke in his mother. No Known Allergies Current Outpatient Medications on File Prior to Visit  Medication Sig Dispense Refill  . cetirizine (ZYRTEC) 10 MG tablet Take 1 tablet (10 mg total) by mouth daily. 90 tablet 3  . lisinopril (ZESTRIL) 10 MG tablet TAKE 1 TABLET(10 MG) BY MOUTH DAILY 90 tablet 3  . pravastatin (PRAVACHOL) 40 MG tablet TAKE 1 TABLET(40 MG) BY MOUTH DAILY 90 tablet 3  . cyclobenzaprine (FLEXERIL) 5 MG tablet Take 1 tablet (5 mg total) by mouth 3 (three) times daily as needed for muscle spasms. (Patient not taking: Reported on 04/12/2020) 30 tablet 1  . Multiple Vitamins-Minerals (MULTIVITAMIN  ADULT PO) Take 1 tablet by mouth daily.    . predniSONE (DELTASONE) 10 MG tablet 3 tabs by mouth per day for 3 days,2tabs per day for 3 days,1tab per day for 3 days (Patient not taking: Reported on 04/12/2020) 18 tablet 0  . traMADol (ULTRAM) 50 MG tablet Take 1 tablet (50 mg total) by mouth every 6 (six) hours as needed. (Patient not taking: Reported on 04/12/2020) 30 tablet 0   No current facility-administered medications on file prior to visit.   Review of Systems All otherwise neg per pt    Objective:   Physical Exam BP 124/82 (BP Location: Left Arm, Patient Position: Sitting, Cuff Size: Normal)   Pulse 90   Temp 98.3 F (36.8 C) (Oral)   Ht 5\' 5"  (1.651 m)   Wt 137 lb (62.1 kg)   SpO2 98%   BMI 22.80 kg/m  VS noted,  Constitutional: Pt appears in NAD HENT: Head: NCAT.  Right Ear: External ear normal.  Left Ear: External ear normal.  Eyes: . Pupils are equal, round, and reactive to light. Conjunctivae and EOM are normal Nose: without d/c or deformity Neck: Neck supple. Gross normal ROM Cardiovascular: Normal rate and regular rhythm.   Pulmonary/Chest: Effort normal and breath sounds without rales or wheezing.  Abd:  Soft,  ND, + BS, no organomegaly, very mild low mid abd tender, declines dre Neurological: Pt is alert. At baseline orientation, motor grossly intact Skin: Skin is warm. No rashes, other new lesions, no LE edema Psychiatric: Pt  behavior is normal without agitation  All otherwise neg per pt  Lab Results  Component Value Date   WBC 7.8 04/12/2020   HGB 14.7 04/12/2020   HCT 44.5 04/12/2020   PLT 317.0 04/12/2020   GLUCOSE 104 (H) 04/12/2020   CHOL 172 10/06/2019   TRIG 253.0 (H) 10/06/2019   HDL 39.60 10/06/2019   LDLDIRECT 98.0 10/06/2019   LDLCALC 143 (H) 09/30/2018   ALT 22 04/12/2020   AST 17 04/12/2020   NA 138 04/12/2020   K 4.4 04/12/2020   CL 102 04/12/2020   CREATININE 0.82 04/12/2020   BUN 19 04/12/2020   CO2 31 04/12/2020   TSH 2.91  10/06/2019   PSA 0.34 10/06/2019   HGBA1C 6.0 10/06/2019      Assessment & Plan:

## 2020-04-12 NOTE — Patient Instructions (Signed)
Please take all new medication as prescribed - the antibiotic for 4 wks  Please consider taking OTC Colace 1 -2 times per day for stool softner if still hard to start to pass  Please continue all other medications as before, and refills have been done if requested.  Please have the pharmacy call with any other refills you may need.  Please continue your efforts at being more active, low cholesterol diet, and weight control.  Please keep your appointments with your specialists as you may have planned  Please go to the LAB at the blood drawing area for the tests to be done  You will be contacted by phone if any changes need to be made immediately.  Otherwise, you will receive a letter about your results with an explanation, but please check with MyChart first.  Please remember to sign up for MyChart if you have not done so, as this will be important to you in the future with finding out test results, communicating by private email, and scheduling acute appointments online when needed.  Please make an Appointment to return in 1 month, or sooner if needed - if you are not better, we may need to consider referral to Urology or Gastroenterology

## 2020-04-13 ENCOUNTER — Encounter: Payer: Self-pay | Admitting: Internal Medicine

## 2020-04-13 LAB — URINE CULTURE: Result:: NO GROWTH

## 2020-04-13 NOTE — Assessment & Plan Note (Signed)
stable overall by history and exam, recent data reviewed with pt, and pt to continue medical treatment as before,  to f/u any worsening symptoms or concerns  

## 2020-04-13 NOTE — Assessment & Plan Note (Addendum)
Etiology unclear, suspect underlying prostatitis or proctitis, for labs as ordered, empiric doxy course, consider CT or GI if not improving, colace prn constipation, to f/u any worsening symptoms or concerns  I spent 31 minutes in preparing to see the patient by review of recent labs, imaging and procedures, obtaining and reviewing separately obtained history, communicating with the patient and family or caregiver, ordering medications, tests or procedures, and documenting clinical information in the EHR including the differential Dx, treatment, and any further evaluation and other management of abd /pelvic pain,hyperglycemia, htn

## 2020-05-24 ENCOUNTER — Ambulatory Visit (INDEPENDENT_AMBULATORY_CARE_PROVIDER_SITE_OTHER): Payer: BC Managed Care – PPO | Admitting: Internal Medicine

## 2020-05-24 ENCOUNTER — Encounter: Payer: Self-pay | Admitting: Internal Medicine

## 2020-05-24 ENCOUNTER — Other Ambulatory Visit: Payer: Self-pay

## 2020-05-24 VITALS — BP 120/86 | HR 80 | Temp 97.9°F | Ht 65.0 in | Wt 140.0 lb

## 2020-05-24 DIAGNOSIS — R7302 Impaired glucose tolerance (oral): Secondary | ICD-10-CM

## 2020-05-24 DIAGNOSIS — R1030 Lower abdominal pain, unspecified: Secondary | ICD-10-CM

## 2020-05-24 DIAGNOSIS — E785 Hyperlipidemia, unspecified: Secondary | ICD-10-CM | POA: Diagnosis not present

## 2020-05-24 DIAGNOSIS — I10 Essential (primary) hypertension: Secondary | ICD-10-CM | POA: Diagnosis not present

## 2020-05-24 NOTE — Progress Notes (Signed)
Subjective:    Patient ID: Roger Long, male    DOB: 04-13-1966, 54 y.o.   MRN: [id number]  HPI  Here to f/u; overall doing ok,  Pt denies chest pain, increasing sob or doe, wheezing, orthopnea, PND, increased LE swelling, palpitations, dizziness or syncope.  Pt denies new neurological symptoms such as new headache, or facial or extremity weakness or numbness.  Pt denies polydipsia, polyuria, or low sugar episode.  Pt states overall good compliance with meds, mostly trying to follow appropriate diet, with wt overall stable,  but little exercise however.  No new complaints.  Denies worsening reflux, abd pain, dysphagia, n/v, bowel change or blood. Past Medical History:  Diagnosis Date  . ALLERGIC RHINITIS 07/19/2009  . BACK PAIN 07/19/2009  . GERD 08/06/2010  . H. pylori infection   . Headache(784.0) 08/06/2010  . HTN (hypertension) 10/06/2019  . HYPERLIPIDEMIA 08/06/2010  . Hypertension   . Impaired glucose tolerance 09/26/2011  . PLANTAR FASCIITIS 07/19/2009   Past Surgical History:  Procedure Laterality Date  . WISDOM TOOTH EXTRACTION      reports that he has quit smoking. He has never used smokeless tobacco. He reports current alcohol use. He reports that he does not use drugs. family history includes Hypertension in his mother; Stroke in his mother. No Known Allergies Current Outpatient Medications on File Prior to Visit  Medication Sig Dispense Refill  . cetirizine (ZYRTEC) 10 MG tablet Take 1 tablet (10 mg total) by mouth daily. 90 tablet 3  . doxycycline (VIBRA-TABS) 100 MG tablet Take 1 tablet (100 mg total) by mouth 2 (two) times daily. 60 tablet 0  . lisinopril (ZESTRIL) 10 MG tablet TAKE 1 TABLET(10 MG) BY MOUTH DAILY 90 tablet 3  . Multiple Vitamins-Minerals (MULTIVITAMIN ADULT PO) Take 1 tablet by mouth daily.    . pravastatin (PRAVACHOL) 40 MG tablet TAKE 1 TABLET(40 MG) BY MOUTH DAILY 90 tablet 3   No current facility-administered medications on file prior to visit.   Review  of Systems All otherwise neg per pt    Objective:   Physical Exam BP 120/86 (BP Location: Left Arm, Patient Position: Sitting, Cuff Size: Large)   Pulse 80   Temp 97.9 F (36.6 C) (Oral)   Ht 5\' 5"  (1.651 m)   Wt 140 lb (63.5 kg)   SpO2 97%   BMI 23.30 kg/m  VS noted,  Constitutional: Pt appears in NAD HENT: Head: NCAT.  Right Ear: External ear normal.  Left Ear: External ear normal.  Eyes: . Pupils are equal, round, and reactive to light. Conjunctivae and EOM are normal Nose: without d/c or deformity Neck: Neck supple. Gross normal ROM Cardiovascular: Normal rate and regular rhythm.   Pulmonary/Chest: Effort normal and breath sounds without rales or wheezing.  Abd:  Soft, NT, ND, + BS, no organomegaly Neurological: Pt is alert. At baseline orientation, motor grossly intact Skin: Skin is warm. No rashes, other new lesions, no LE edema Psychiatric: Pt behavior is normal without agitation  All otherwise neg per pt Lab Results  Component Value Date   WBC 7.8 04/12/2020   HGB 14.7 04/12/2020   HCT 44.5 04/12/2020   PLT 317.0 04/12/2020   GLUCOSE 104 (H) 04/12/2020   CHOL 172 10/06/2019   TRIG 253.0 (H) 10/06/2019   HDL 39.60 10/06/2019   LDLDIRECT 98.0 10/06/2019   LDLCALC 143 (H) 09/30/2018   ALT 22 04/12/2020   AST 17 04/12/2020   NA 138 04/12/2020   K 4.4 04/12/2020  CL 102 04/12/2020   CREATININE 0.82 04/12/2020   BUN 19 04/12/2020   CO2 31 04/12/2020   TSH 2.91 10/06/2019   PSA 0.34 10/06/2019   HGBA1C 6.0 10/06/2019      Assessment & Plan:

## 2020-05-24 NOTE — Patient Instructions (Signed)
Please continue all other medications as before, and refills have been done if requested.  Please have the pharmacy call with any other refills you may need.  Please continue your efforts at being more active, low cholesterol diet, and weight control.  Please keep your appointments with your specialists as you may have planned  Please make an Appointment to return for your 1 year visit, or sooner if needed, with Lab testing by Appointment as well, to be done about 3-5 days before at the [facility] (so this is for TWO appointments - please see the scheduling desk as you leave)

## 2020-05-26 ENCOUNTER — Encounter: Payer: Self-pay | Admitting: Internal Medicine

## 2020-05-26 NOTE — Assessment & Plan Note (Signed)
stable overall by history and exam, recent data reviewed with pt, and pt to continue medical treatment as before,  to f/u any worsening symptoms or concerns  

## 2020-05-26 NOTE — Assessment & Plan Note (Signed)
Resolved,  to f/u any worsening symptoms or concerns  

## 2020-05-26 NOTE — Assessment & Plan Note (Addendum)
stable overall by history and exam, recent data reviewed with pt, and pt to continue medical treatment as before,  to f/u any worsening symptoms or concerns  I spent 31 minutes in preparing to see the patient by review of recent labs, imaging and procedures, obtaining and reviewing separately obtained history, communicating with the patient and family or caregiver, ordering medications, tests or procedures, and documenting clinical information in the EHR including the differential Dx, treatment, and any further evaluation and other management of hyperglycemia, htn, hld, abd pain

## 2020-08-21 ENCOUNTER — Ambulatory Visit (INDEPENDENT_AMBULATORY_CARE_PROVIDER_SITE_OTHER): Payer: BC Managed Care – PPO | Admitting: Nurse Practitioner

## 2020-08-21 ENCOUNTER — Encounter: Payer: Self-pay | Admitting: Nurse Practitioner

## 2020-08-21 VITALS — BP 122/80 | HR 97 | Ht 63.0 in | Wt 143.0 lb

## 2020-08-21 DIAGNOSIS — K219 Gastro-esophageal reflux disease without esophagitis: Secondary | ICD-10-CM

## 2020-08-21 MED ORDER — OMEPRAZOLE 40 MG PO CPDR
40.0000 mg | DELAYED_RELEASE_CAPSULE | Freq: Every day | ORAL | 3 refills | Status: DC
Start: 2020-08-21 — End: 2021-01-21

## 2020-08-21 NOTE — Patient Instructions (Addendum)
If you are age 52 or older, your body mass index should be between 23-30. Your Body mass index is 25.33 kg/m. If this is out of the aforementioned range listed, please consider follow up with your Primary Care Provider.  If you are age 83 or younger, your body mass index should be between 19-25. Your Body mass index is 25.33 kg/m. If this is out of the aformentioned range listed, please consider follow up with your Primary Care Provider.   We have sent the following medications to your pharmacy for you to pick up at your convenience:  Omeprazole 40 mg 20-30 minutes before breakfast.  Follow up in 4-6 weeks.   B?nh tro ng??c d? dy th?c qu?n, Ng??i l?n Gastroesophageal Reflux Disease, Adult Tro ng??c d? dy th?c qu?n (GER) x?y ra khi axit t? d? dy tro ln ?ng k?t n?i mi?ng v d? dy (th?c qu?n). Thng th??ng, th?c ?n di chuy?n xu?ng th?c qu?n v ? trong d? dy ?? tiu ha. Tuy nhin, khi m?t ng??i b? GER, th?c ?n v axit trong d? dy ?i khi tro ng??c tr? [place] th?c qu?n. N?u tnh tr?ng ny tr? thnh m?t v?n ?? nghim tr?ng h?n, ng??i ? c th? ???c ch?n ?on b? b?nh g?i l b?nh tro ng??c d? dy th?c qu?n (GERD). GERD x?y ra khi tro ng??c:  Xu?t hi?n th??ng xuyn.  Gy ra cc tri?u ch?ng th??ng xuyn ho?c n?ng.  Gy ra cc v?n ?? nh? th??ng t?n th?c qu?n. Khi axit d? dy ti?p xc v?i th?c qu?n, axit c th? gy lot (vim) trong th?c qu?n. Theo th?i gian, GERD c th? t?o ra cc l? nh? (v?t lot) ? nim m?c th?c qu?n. Nguyn nhn g gy ra? Tnh tr?ng ny do m?t v?n ?? c?a ph?n c? gi?a th?c qu?n v d? dy (c? th?t th?c qu?n d??i, hay l lower esophageal sphincter, LES) gy ra. Thng th??ng c? LES ?ng l?i sau khi th?c ?n ?i qua th?c qu?n vo d? dy. Khi LES b? y?u ho?c b?t th??ng, c? khng ?ng theo ?ng cch v ?i?u ? khi?n cho th?c ?n v a xt d? dy tro ng??c tr? l?i th?c qu?n. LES c th? b? y?u do m?t s? ch?t ?n [place] nh?t ??nh, thu?c v cc tnh tr?ng b?nh l, bao g?m:  S? d?ng  thu?c l.  Mang thai.  Thot v? honh.  S? d?ng r??u.  M?t s? lo?i th?c ?n v ?? u?ng nh?t ??nh, nh? c ph, s c la, hnh v b?c h. ?i?u g lm t?ng nguy c?? Tnh tr?ng ny d? x?y ra h?n n?u qu v?:  B? t?ng cn.  B? r?i lo?n m lin k?t.  S? d?ng thu?c ch?ng vim khng steroid (NSAID). Cc d?u hi?u ho?c tri?u ch?ng l g? Nh?ng tri?u ch?ng c?a tnh tr?ng ny bao g?m:  ? nng.  Kh nu?t ho?c ?au khi nu?t.  C?m th?y nh? c m?t c?c trong h?ng.  C?m gic ??ng trong mi?ng.  H?i th? hi.  C nhi?u n??c b?t.  C?m gic kh ch?u trong b?ng ho?c ch??ng b?ng.  ? h?i.  ?au ng?c. Nh?ng tnh tr?ng khc nhau c th? gy ?au ng?c. B?o ??m qu v? ?i khm chuyn gia ch?m [place] s?c kh?e n?u qu v? b? ?au ng?c.  Kh th? ho?c th? kh kh.  Ho lin t?c (m?n tnh) ho?c ho vo ban ?m.  B? h?ng l?p men r?ng.  S?t cn. Ch?n ?on tnh tr?ng ny nh? th? no?  Chuyn gia ch?m [place] s?c kh?e c?a qu v? s? h?i v? b?nh s? v khm th?c th? cho qu v?. ?? xc ??nh qu v? b? GERD nh? hay n?ng, chuyn gia ch?m [place] s?c kh?e c?ng c th? theo di qu v? ?p ?ng v?i vi?c ?i?u tr? nh? th? no. Qu v? c?ng c th? ???c lm cc ki?m tra, bao g?m:  M?t ki?m tra ?? khm d? dy v th?c qu?n b?ng m?t camera nh? (n?i soi).  Ki?m tra ?o n?ng ?? a xt trong th?c qu?n c?a qu v?.  Ki?m tra ?o m?c p l?c ln th?c qu?n c?a qu v?.  Ki?m tra nu?t bari ho?c nu?t bari bi?n tnh ?? cho th?y hnh d?ng, kch th??c v ch?c n?ng c?a th?c qu?n c?a qu v?. Tnh tr?ng ny ???c ?i?u tr? nh? th? no? M?c tiu c?a ?i?u tr? l gip lm gi?m cc tri?u ch?ng v ng?n ng?a bi?n ch?ng. Vi?c ?i?u tr? b?nh ny c th? khc nhau ty thu?c m?c ?? n?ng c?a tri?u ch?ng. Chuyn gia ch?m [place] s?c kh?e c?a qu v? c th? khuy?n ngh?:  Thay ??i ch? ?? ?n.  Thu?c.  Ph?u thu?t. Tun th? nh?ng h??ng d?n ny ? nh: ?n v u?ng   Tun th? m?t ch? ?? ?n theo khuy?n ngh? c?a chuyn gia ch?m [place] s?c kh?e. Vi?c ny c th? l trnh cc th?c ?n v  ?? u?ng nh?: ? C ph v tr (c ho?c khng c caffeine). ? ?? u?ng cr??u. ? ?? u?ng t?ng l?c v ?? u?ng dng trong th? thao. ? ?? u?ng c ga ho?c soda. ? S-c-la v cacao. ? B?c h v h??ng b?c h. ? T?i v hnh. ? C?i ng?a (Horseradish). ? Cc th?c ?n nhi?u gia v? v axit, bao g?m h?t tiu, b?t ?t, b?t cri, gi?m, n??c s?t cay v n??c s?t th?t n??ng. ? N??c qu? ho?c qu? h? cam qut, ch?ng h?n nh? cam, chanh v chanh l cam. ? Cc th?c ?n c c chua, nh? n??c x?t ??, ?t, n??c x?t salsa v pizza km x?t ??. ? Th?c ?n chin v nhi?u ch?t bo, ch?ng h?n nh? bnh rn, khoai ty chin, khoai ty rn v n??c x?t nhi?u ch?t bo. ? Th?t nhi?u ch?t bo, ch?ng h?n nh? hot dog (bnh m k?p xc xch) v cc lo?i th?t ?? v tr?ng nhi?u m?, ch?ng h?n nh? th?t n?c l?ng, xc xch, gi?m bng v th?t l?n xng khi. ? Nh?ng s?n ph?m b? s?a giu ch?t bo, nh? s?a nguyn kem, b? v pho mt kem.  ?n cc b?a nh?, th??ng xuyn thay v cc b?a no.  Trnh u?ng nhi?u n??c khi ?n.  Trnh ?n trong kho?ng 2-3 gi? tr??c khi ?i ng?.  Trnh n?m xu?ng ngay sau khi ?n.  Khng t?p th? d?c ngay sau khi ?n. L?i s?ng   Khng s? d?ng b?t k? s?n ph?m no c nicotine ho?c thu?c l, ch?ng ha?n nh? thu?c l d?ng ht, thu?c l ?i?n t? v thu?c l d?ng nhai. N?u qu v? c?n gip ?? ?? cai thu?c, hy h?i chuyn gia ch?m [place] s?c kh?e.  C? g?ng gi?m c?ng th?ng b?ng cch s? d?ng cc ph??ng php nh? yoga ho?c thi?n. N?u qu v? c?n gip ?? ?? lm gi?m c?ng th?ng, hy h?i chuyn gia ch?m [place] s?c kh?e.  N?u qu v? th?a cn, hy gi?m cn v? m?c c l?i cho s?c kh?e c?a qu v?. Hy h?i chuyn gia ch?m [place]  s?c kh?e ?? ???c h??ng d?n v? m?c tiu gi?m cn an ton. H??ng d?n chung  Ch  ??n b?t c? thay ??i no v? tri?u ch?ng c?a qu v?.  Ch? s? d?ng thu?c khng k ??n v thu?c k ??n theo ch? d?n c?a chuyn gia ch?m [place] s?c kh?e. Khng dng aspirin, ibuprofen, ho?c cc thu?c ch?ng vim khng steroid (NSAID) khc tr? khi chuyn gia  ch?m [place] s?c kh?e c?a qu v? cho php.  M?c qu?n o r?ng. Khng m?c ci g ch?t quanh eo m c th? t?o p l?c ln b?ng.  Nng (nng cao) ??u gi??ng c?a qu v? thm 6 ins? (15 cm).  Trnh g?p ng??i n?u ?i?u ny khi?n cc tri?u ch?ng tr? nn tr?m tr?ng h?n.  Tun th? t?t c? cc l?n khm theo di theo ch? d?n c?a chuyn gia ch?m [place] s?c kh?e. ?i?u ny c vai tr quan tr?ng. Hy lin l?c v?i chuyn gia ch?m [place] s?c kh?e n?u:  Qu v? bi?: ? Cc tri?u ch?ng m?i. ? S?t cn khng r nguyn nhn. ? Kh nu?t ho?c b? ?au khi nu?t. ? Th? kh kh ho?c ho dai d?ng. ? Gi?ng khn.  Cc tri?u ch?ng c?a qu v? khng c?i thi?n sau khi ???c ?i?u tr?Tasia [place]atchings c?u tr? gip ngay l?p t?c n?u qu v?:  B? ?au ? cnh tay, c?, hm, r?ng ho?c l?ng.  C?m th?y ?? m? hi, chng m?t ho?c chong vng.  B? ?au ng?c ho?c kh th?.  Nn v ch?t nn ra gi?ng nh? mu ho?c b c ph.  Ng?t x?u.  C phn l?n mu ho?c mu ?en.  Khng th? nu?t, u?ng ho?c ?n. Tm t?t  Tro ng??c d? dy th?c qu?n x?y ra khi axit t? d? dy tro ln th?c qu?n. GERD l b?nh trong ? tro ng??c x?y ra th??ng xuyn, gy ra cc tri?u ch?ng n?ng ho?c th??ng xuyn, ho?c gy ra cc v?n ?? nh? t?n th??ng th?c qu?n.  Vi?c ?i?u tr? b?nh ny c th? khc nhau ty thu?c m?c ?? n?ng c?a tri?u ch?ng. Chuyn gia ch?m [place] s?c kh?e c th? khuy?n ngh? thay ??i ch? ?? ?n u?ng v l?i s?ng, dng thu?c ho?c ph?u thu?t.  Hy lin h? v?i chuyn gia ch?m [place] s?c kh?e n?u qu v? c tri?u ch?ng m?i ho?c tri?u ch?ng ? nn tr?m tr?ng h?n.  Ch? s? d?ng thu?c khng k ??n v thu?c k ??n theo ch? d?n c?a chuyn gia ch?m [place] s?c kh?e. Khng dng aspirin, ibuprofen, ho?c cc thu?c ch?ng vim khng steroid (NSAID) khc tr? khi chuyn gia ch?m [place] s?c kh?e c?a qu v? cho php.  Tun th? t?t c? cc l?n khm theo di theo ch? d?n c?a chuyn gia ch?m [place] s?c kh?e. ?i?u ny c vai tr quan tr?ng. Thng tin ny khng nh?m m?c ?ch thay th? cho l?i khuyn m chuyn gia ch?m [place]  s?c kh?e ni v?i qu v?. Hy b?o ??m qu v? ph?i th?o lu?n b?t k? v?n ?? g m qu v? c v?i chuyn gia ch?m  s?c kh?e c?a qu v?. Document Revised: 07/08/2018 Document Reviewed: 07/08/2018 Elsevier Patient Education  2020 [employer].

## 2020-08-21 NOTE — Progress Notes (Signed)
     ASSESSMENT AND PLAN     # GERD with heartburn and regurgitation --Trial of Omeprazole 40 mg q am --ant-reflux measures discussed. Written literature provided in [place].  --Follow up with me in 4-5 weeks.   #Gastric intestinal metaplasia (2019) --For recall EGD September 2022  HISTORY OF PRESENT ILLNESS     Primary Gastroenterologist :Wilfrid Lund, MD  Chief Complaint : heartburn   Roger Long is a 54 y.o. male with PMH / [place] significant for,  but not necessarily limited to: H.pylori infection, gastric intestinal metaplasia, GERD, adenomatous colon polyps.  Patient here for evaluation of heartburn / regurgitation of bitter juices. Symptoms started about one month ago, more intermittent at that time.  Now having frequent symptoms, sometimes he cannot sleep at night but symptoms also occur throughout the day. Takes Tums but they don't help. Sleep on two pillows. Unable to elevate HOB. Doesn't lay down for two hours after eating. He describes epigastric tenderness when area pressed upon.     Previous Endoscopic Evaluations / Pertinent Studies:  July 2019 EGD for heartburn, bloating, epigastric pain --Normal esophagus. - Gastritis. Biopsied. - Normal examined duodenum.  July 2019 screening colonoscopy --2 sessile colon polyps 3 to 4 mm in size  Surgical [P], random gastric sites - CHRONIC ACTIVE GASTRITIS WITH INTESTINAL METAPLASIA - H. PYLORI ORGANISMS PRESENT - SEE COMMENT 2. Surgical [P], ascending and sigmoid, polyp (2) - TUBULAR ADENOMA (2 OF 2 FRAGMENTS) - NO HIGH GRADE DYSPLASIA OR MALIGNANCY IDENTIFIED   Past Medical History:  Diagnosis Date  . ALLERGIC RHINITIS 07/19/2009  . BACK PAIN 07/19/2009  . GERD 08/06/2010  . H. pylori infection   . Headache(784.0) 08/06/2010  . HTN (hypertension) 10/06/2019  . HYPERLIPIDEMIA 08/06/2010  . Hypertension   . Impaired glucose tolerance 09/26/2011  . PLANTAR FASCIITIS 07/19/2009    Current Medications, Allergies, Past  Surgical History, Family History and Social History were reviewed in [employer] record.   Review of Systems: No chest pain. No shortness of breath. No urinary complaints.   PHYSICAL EXAM :    Wt Readings from Last 3 Encounters:  08/21/20 143 lb (64.9 kg)  05/24/20 140 lb (63.5 kg)  04/12/20 137 lb (62.1 kg)    BP 122/80   Pulse 97   Ht 5\' 3"  (1.6 m)   Wt 143 lb (64.9 kg)   BMI 25.33 kg/m  Constitutional:  Pleasant male in no acute distress. Psychiatric: Normal mood and affect. Behavior is normal. EENT: Pupils normal.  Conjunctivae are normal. No scleral icterus. Neck supple.  Cardiovascular: Normal rate, regular rhythm. No edema Pulmonary/chest: Effort normal and breath sounds normal. No wheezing, rales or rhonchi. Abdominal: Soft, nondistended, nontender. Bowel sounds active throughout. There are no masses palpable. No hepatomegaly. Neurological: Alert and oriented to person place and time. Skin: Skin is warm and dry. No rashes noted.  Tye Savoy, NP  08/21/2020, 11:15 AM  Cc:  Referring Provider Biagio Borg, MD

## 2020-08-21 NOTE — Progress Notes (Signed)
____________________________________________________________  Attending physician addendum:  Thank you for sending this case to me. I have reviewed the entire note, and the outlined plan seems appropriate.  Incidentally, Urea breath testing in Oct 2019 showed clearance of H pylori after the second round of antibiotics.  Wilfrid Lund, MD  ____________________________________________________________

## 2020-08-23 ENCOUNTER — Telehealth: Payer: Self-pay | Admitting: Gastroenterology

## 2020-08-23 ENCOUNTER — Encounter: Payer: Self-pay | Admitting: Gastroenterology

## 2020-08-23 NOTE — Telephone Encounter (Signed)
Error

## 2020-08-23 NOTE — Telephone Encounter (Signed)
Lm on vm for patient to return call 

## 2020-08-23 NOTE — Telephone Encounter (Signed)
Spoke with patient's brother, he interprets for the patient. He states that patient symptoms have gotten worse. He was asked if they picked up the prescription for Omeprazole 40 mg, he stated that the patient said it was over $100, I clarified multiple times that he was referring to the prescription for omeprazole. Advised that usually if a medication is not covered the pharmacy will contact us for a prior authorization, advised that I would look into this. Reviewed anti reflux measures such as foods to avoid, lifestyle changes, not eating right before bed, staying as elevated as possible and taking Omeprazole 20-30 minutes before breakfast. Brother reports that patient had complaints of shoulder pain, advised that he would need to see his PCP regarding shoulder pain.   Heather, have you seen a prior authorization regarding Omeprazole 40 mg? Thanks

## 2020-08-23 NOTE — Telephone Encounter (Signed)
Please return call to brother He Massman (prnounced win) at [phone]

## 2020-08-26 NOTE — Telephone Encounter (Signed)
I have not received a PA request from pharmacy. I went ahead and submitted PA for Omeprazole 40 mg via [employer].

## 2020-09-02 NOTE — Telephone Encounter (Signed)
Spoke with patient's brother and patient was able to get the Omeprazole.

## 2020-09-20 ENCOUNTER — Other Ambulatory Visit: Payer: Self-pay | Admitting: Internal Medicine

## 2020-09-20 NOTE — Telephone Encounter (Signed)
Please refill as per office routine med refill policy (all routine meds refilled for 3 mo or monthly per pt preference up to one year from last visit, then month to month grace period for 3 mo, then further med refills will have to be denied)  

## 2020-09-27 ENCOUNTER — Other Ambulatory Visit: Payer: Self-pay | Admitting: Internal Medicine

## 2020-10-02 ENCOUNTER — Ambulatory Visit (INDEPENDENT_AMBULATORY_CARE_PROVIDER_SITE_OTHER): Payer: BC Managed Care – PPO | Admitting: Nurse Practitioner

## 2020-10-02 ENCOUNTER — Encounter: Payer: Self-pay | Admitting: Nurse Practitioner

## 2020-10-02 VITALS — BP 104/72 | HR 78 | Ht 62.0 in | Wt 141.0 lb

## 2020-10-02 DIAGNOSIS — Z86010 Personal history of colonic polyps: Secondary | ICD-10-CM

## 2020-10-02 DIAGNOSIS — K219 Gastro-esophageal reflux disease without esophagitis: Secondary | ICD-10-CM | POA: Diagnosis not present

## 2020-10-02 MED ORDER — FAMOTIDINE 20 MG PO TABS
20.0000 mg | ORAL_TABLET | Freq: Every day | ORAL | 2 refills | Status: DC
Start: 2020-10-02 — End: 2023-11-05

## 2020-10-02 NOTE — Progress Notes (Signed)
ASSESSMENT AND PLAN    # Chronic GERD, partial response to Prilosec 40 mg daily before breakfast.  --Following anti-reflux measures.  --Add Pepcid 40 mg Q HS.  --Call me in 2 weeks with a condition update.   # Gastric intestinal metaplasia (2019) --For recall EGD September 2022  # Hx of H.pylori gastritis --Treated. Follow up testing showed eradication.   # Hx of adenomatous colon polyps --complete colonoscopy with excellent bowel prep July 2019. Two 3 to 4 mm tubular adenomas removed --He is on for 5 year recall colonoscopy in 2024. With change in polyp surveillance guidelines he may not need it that soon but will defer to Dr. Loletha Carrow.      HISTORY OF PRESENT ILLNESS     Primary Gastroenterologist : .Wilfrid Lund, MD  Chief Complaint : GERD follow up  Roger Long is a 54 y.o. male with PMH / [place] significant for,  but not necessarily limited to: HTN, hyperlipidemia, GERD, H.pylori gastritis, intestinal metaplasia   Patient evaluated in 2019 for pyrosis and regurgitation. He also has chronic bloating. He subsequently underwent EGD which showed a normal esophagus, gastritis.  Biopsies compatible with chronic active gastritis/H. pylori organisms.  He was treated with quadruple therapy, follow-up H. pylori breath test was still positive so he was treated with amoxicillin, Levaquin and PPI.  Follow-up breath test for H. pylori was negative  I saw the patient early September 2021 with recurrent pyrosis and regurgitation.  He was not on any GERD treatment at the time but was taking Tums.  We discussed antireflux measures, he was started on omeprazole 40 mg every morning.  Patient is here for follow-up  Interval History:  He is not much better on Omeprazole. He doesn't have a sour taste in mouth anymore but still gets heartburn and regurgitation during the day and at night.  With regurgitation, food nor fluid actually enter into his mouth.  He describes a sensation of liquid rising up  into his esophagus.  When this happens he gets dizzy . He sleeps on two pillows, cannot elevate HOB  Goes to bed on empty stomach. He doesn't consume caffeine.    Also complains of postprandial bloating. GasX helps bloating.   Past Medical History:  Diagnosis Date  . ALLERGIC RHINITIS 07/19/2009  . BACK PAIN 07/19/2009  . GERD 08/06/2010  . H. pylori infection   . Headache(784.0) 08/06/2010  . HTN (hypertension) 10/06/2019  . HYPERLIPIDEMIA 08/06/2010  . Hypertension   . Impaired glucose tolerance 09/26/2011  . PLANTAR FASCIITIS 07/19/2009    Current Medications, Allergies, Past Surgical History, Family History and Social History were reviewed in [employer] record.   Current Outpatient Medications  Medication Sig Dispense Refill  . calcium carbonate (TUMS - DOSED IN MG ELEMENTAL CALCIUM) 500 MG chewable tablet Chew 1 tablet by mouth daily.    Marland Kitchen lisinopril (ZESTRIL) 10 MG tablet TAKE 1 TABLET(10 MG) BY MOUTH DAILY 90 tablet 3  . omeprazole (PRILOSEC) 40 MG capsule Take 1 capsule (40 mg total) by mouth daily. 30 capsule 3  . pravastatin (PRAVACHOL) 40 MG tablet TAKE 1 TABLET BY MOUTH DAILY 90 tablet 3   No current facility-administered medications for this visit.    Review of Systems: Ocassional dizziness. No chest pain. No shortness of breath. No urinary complaints.   PHYSICAL EXAM :    Wt Readings from Last 3 Encounters:  10/02/20 141 lb (64 kg)  08/21/20 143 lb (64.9 kg)  05/24/20 140  lb (63.5 kg)    BP 104/72   Pulse 78   Ht 5\' 2"  (1.575 m)   Wt 141 lb (64 kg)   BMI 25.79 kg/m  Constitutional:  Pleasant male in no acute distress. Psychiatric: Normal mood and affect. Behavior is normal. EENT: Pupils normal.  Conjunctivae are normal. No scleral icterus. Neck supple.  Cardiovascular: Normal rate, regular rhythm. No edema Pulmonary/chest: Effort normal and breath sounds normal. No wheezing, rales or rhonchi. Abdominal: Soft, nondistended, nontender.  Bowel sounds active throughout. There are no masses palpable. No hepatomegaly. Neurological: Alert and oriented to person place and time. Skin: Skin is warm and dry. No rashes noted.  Tye Savoy, NP  10/02/2020, 11:20 AM

## 2020-10-02 NOTE — Patient Instructions (Addendum)
Continue taking Prilosec 40mg  in the morning.   START: Pepcid ( famotidine) 20mg  at bedtime.   We have sent the following medications to your pharmacy for you to pick up at your convenience: Pepcid ( Famotidine)   Call or send mychart message in 2 weeks to let Nevin Bloodgood know how you are doing.   You will be due for a recall Endoscopy in Sept 2022. We will send you a reminder in the mail when it gets closer to that time.  If you are age 52 or older, your body mass index should be between 23-30. Your Body mass index is 25.79 kg/m. If this is out of the aforementioned range listed, please consider follow up with your Primary Care Provider.  If you are age 2 or younger, your body mass index should be between 19-25. Your Body mass index is 25.79 kg/m. If this is out of the aformentioned range listed, please consider follow up with your Primary Care Provider.    Thank you for choosing me and [facility].  Tye Savoy NP

## 2020-10-09 NOTE — Progress Notes (Signed)
____________________________________________________________  Attending physician addendum:  Thank you for sending this case to me. I have reviewed the entire note, and the outlined plan seems appropriate.  Please arrange a follow up clinic visit with me.  He would probably benefit from an EGD with Bravo pH study off acid suppression meds as workup for consideration of TIF or fundoplication.  Wilfrid Lund, MD  ____________________________________________________________

## 2020-10-10 ENCOUNTER — Telehealth: Payer: Self-pay

## 2020-10-10 ENCOUNTER — Other Ambulatory Visit: Payer: Self-pay

## 2020-10-10 NOTE — Telephone Encounter (Signed)
Please refer to the office encounter dated 10/02/20. Per Dr Loletha Carrow the patient needs follow with him in regards to his GERD. I called the patient. Explained the reason for my call. Patient states he understands but he was planning on calling us in 2 weeks as previously instructed. If he is not better, then he will make an appointment at that time. He states he hopes not to have to see the doctor.

## 2020-10-11 ENCOUNTER — Ambulatory Visit (INDEPENDENT_AMBULATORY_CARE_PROVIDER_SITE_OTHER): Payer: BC Managed Care – PPO | Admitting: Internal Medicine

## 2020-10-11 VITALS — BP 114/68 | HR 87 | Temp 98.2°F | Ht 62.0 in | Wt 140.0 lb

## 2020-10-11 DIAGNOSIS — I10 Essential (primary) hypertension: Secondary | ICD-10-CM

## 2020-10-11 DIAGNOSIS — E559 Vitamin D deficiency, unspecified: Secondary | ICD-10-CM | POA: Diagnosis not present

## 2020-10-11 DIAGNOSIS — Z1159 Encounter for screening for other viral diseases: Secondary | ICD-10-CM | POA: Diagnosis not present

## 2020-10-11 DIAGNOSIS — R42 Dizziness and giddiness: Secondary | ICD-10-CM | POA: Insufficient documentation

## 2020-10-11 DIAGNOSIS — Z0001 Encounter for general adult medical examination with abnormal findings: Secondary | ICD-10-CM

## 2020-10-11 DIAGNOSIS — K219 Gastro-esophageal reflux disease without esophagitis: Secondary | ICD-10-CM | POA: Diagnosis not present

## 2020-10-11 DIAGNOSIS — E538 Deficiency of other specified B group vitamins: Secondary | ICD-10-CM | POA: Diagnosis not present

## 2020-10-11 DIAGNOSIS — R7302 Impaired glucose tolerance (oral): Secondary | ICD-10-CM

## 2020-10-11 DIAGNOSIS — E785 Hyperlipidemia, unspecified: Secondary | ICD-10-CM

## 2020-10-11 DIAGNOSIS — Z0000 Encounter for general adult medical examination without abnormal findings: Secondary | ICD-10-CM

## 2020-10-11 DIAGNOSIS — Z125 Encounter for screening for malignant neoplasm of prostate: Secondary | ICD-10-CM

## 2020-10-11 LAB — URINALYSIS, ROUTINE W REFLEX MICROSCOPIC
Bilirubin Urine: NEGATIVE
Hgb urine dipstick: NEGATIVE
Ketones, ur: NEGATIVE
Leukocytes,Ua: NEGATIVE
Nitrite: NEGATIVE
Specific Gravity, Urine: 1.02 (ref 1.000–1.030)
Total Protein, Urine: NEGATIVE
Urine Glucose: NEGATIVE
Urobilinogen, UA: 0.2 (ref 0.0–1.0)
WBC, UA: NONE SEEN — AB (ref 0–?)
pH: 7.5 (ref 5.0–8.0)

## 2020-10-11 LAB — CBC WITH DIFFERENTIAL/PLATELET
Basophils Absolute: 0 10*3/uL (ref 0.0–0.1)
Basophils Relative: 0.5 % (ref 0.0–3.0)
Eosinophils Absolute: 0.4 10*3/uL (ref 0.0–0.7)
Eosinophils Relative: 5.3 % — ABNORMAL HIGH (ref 0.0–5.0)
HCT: 43.7 % (ref 39.0–52.0)
Hemoglobin: 14.4 g/dL (ref 13.0–17.0)
Lymphocytes Relative: 39.1 % (ref 12.0–46.0)
Lymphs Abs: 2.8 10*3/uL (ref 0.7–4.0)
MCHC: 33 g/dL (ref 30.0–36.0)
MCV: 90.4 fl (ref 78.0–100.0)
Monocytes Absolute: 0.5 10*3/uL (ref 0.1–1.0)
Monocytes Relative: 6.7 % (ref 3.0–12.0)
Neutro Abs: 3.4 10*3/uL (ref 1.4–7.7)
Neutrophils Relative %: 48.4 % (ref 43.0–77.0)
Platelets: 318 10*3/uL (ref 150.0–400.0)
RBC: 4.83 Mil/uL (ref 4.22–5.81)
RDW: 13.2 % (ref 11.5–15.5)
WBC: 7.1 10*3/uL (ref 4.0–10.5)

## 2020-10-11 LAB — BASIC METABOLIC PANEL
BUN: 14 mg/dL (ref 6–23)
CO2: 32 mEq/L (ref 19–32)
Calcium: 8.6 mg/dL (ref 8.4–10.5)
Chloride: 104 mEq/L (ref 96–112)
Creatinine, Ser: 0.87 mg/dL (ref 0.40–1.50)
GFR: 99.29 mL/min (ref >60.00)
Glucose, Bld: 92 mg/dL (ref 70–99)
Potassium: 4.9 mEq/L (ref 3.5–5.1)
Sodium: 142 mEq/L (ref 135–145)

## 2020-10-11 LAB — TSH: TSH: 1.86 u[IU]/mL (ref 0.35–4.50)

## 2020-10-11 LAB — HEPATIC FUNCTION PANEL
ALT: 24 U/L (ref 0–53)
AST: 16 U/L (ref 0–37)
Albumin: 4.4 g/dL (ref 3.5–5.2)
Alkaline Phosphatase: 71 U/L (ref 39–117)
Bilirubin, Direct: 0.1 mg/dL (ref 0.0–0.3)
Total Bilirubin: 0.3 mg/dL (ref 0.2–1.2)
Total Protein: 6.9 g/dL (ref 6.0–8.3)

## 2020-10-11 LAB — PSA: PSA: 0.46 ng/mL (ref 0.10–4.00)

## 2020-10-11 LAB — HEMOGLOBIN A1C: Hgb A1c MFr Bld: 6.4 % (ref 4.6–6.5)

## 2020-10-11 LAB — LIPID PANEL
Cholesterol: 187 mg/dL (ref 0–200)
HDL: 36 mg/dL — ABNORMAL LOW (ref >39.00)
NonHDL: 151.04
Total CHOL/HDL Ratio: 5
Triglycerides: 320 mg/dL — ABNORMAL HIGH (ref 0.0–149.0)
VLDL: 64 mg/dL — ABNORMAL HIGH (ref 0.0–40.0)

## 2020-10-11 LAB — LDL CHOLESTEROL, DIRECT: Direct LDL: 110 mg/dL

## 2020-10-11 LAB — VITAMIN D 25 HYDROXY (VIT D DEFICIENCY, FRACTURES): VITD: 21.07 ng/mL — ABNORMAL LOW (ref 30.00–100.00)

## 2020-10-11 LAB — VITAMIN B12: Vitamin B-12: 624 pg/mL (ref 211–911)

## 2020-10-11 NOTE — Patient Instructions (Signed)
Please continue all other medications as before, including to start the pepcid 40 mg at bedtime  Please have the pharmacy call with any other refills you may need.  Please continue your efforts at being more active, low cholesterol diet, and weight control.  You are otherwise up to date with prevention measures today.  Please keep your appointments with your specialists as you may have planned  Please go to the LAB at the blood drawing area for the tests to be done  You will be contacted by phone if any changes need to be made immediately.  Otherwise, you will receive a letter about your results with an explanation, but please check with MyChart first.  Please remember to sign up for MyChart if you have not done so, as this will be important to you in the future with finding out test results, communicating by private email, and scheduling acute appointments online when needed.  Please make an Appointment to return for your 1 year visit, or sooner if needed

## 2020-10-11 NOTE — Progress Notes (Signed)
Subjective:    Patient ID: Roger Long, male    DOB: [birth year], 54 y.o.   MRN: [id number]  HPI  Here for wellness and f/u;  Overall doing ok;  Pt denies Chest pain, worsening SOB, DOE, wheezing, orthopnea, PND, worsening LE edema, palpitations, dizziness or syncope.  Pt denies neurological change such as new headache, facial or extremity weakness.  Pt denies polydipsia, polyuria, or low sugar symptoms. Pt states overall good compliance with treatment and medications, good tolerability, and has been trying to follow appropriate diet.  Pt denies worsening depressive symptoms, suicidal ideation or panic. No fever, night sweats, wt loss, loss of appetite, or other constitutional symptoms.  Pt states good ability with ADL's, has low fall risk, home safety reviewed and adequate, no other significant changes in hearing or vision, and only occasionally active with exercise.  Has ongoing gerd but not yet started the pepcid.   Past Medical History:  Diagnosis Date  . ALLERGIC RHINITIS 07/19/2009  . BACK PAIN 07/19/2009  . GERD 08/06/2010  . H. pylori infection   . Headache(784.0) 08/06/2010  . HTN (hypertension) 10/06/2019  . HYPERLIPIDEMIA 08/06/2010  . Hypertension   . Impaired glucose tolerance 09/26/2011  . PLANTAR FASCIITIS 07/19/2009   Past Surgical History:  Procedure Laterality Date  . WISDOM TOOTH EXTRACTION      reports that he has quit smoking. He has never used smokeless tobacco. He reports current alcohol use. He reports that he does not use drugs. family history includes Hypertension in his mother; Stroke in his mother. No Known Allergies Current Outpatient Medications on File Prior to Visit  Medication Sig Dispense Refill  . calcium carbonate (TUMS - DOSED IN MG ELEMENTAL CALCIUM) 500 MG chewable tablet Chew 1 tablet by mouth daily.    . famotidine (PEPCID) 20 MG tablet Take 1 tablet (20 mg total) by mouth at bedtime. 30 tablet 2  . lisinopril (ZESTRIL) 10 MG tablet TAKE 1 TABLET(10 MG) BY  MOUTH DAILY 90 tablet 3  . omeprazole (PRILOSEC) 40 MG capsule Take 1 capsule (40 mg total) by mouth daily. 30 capsule 3  . pravastatin (PRAVACHOL) 40 MG tablet TAKE 1 TABLET BY MOUTH DAILY 90 tablet 3   No current facility-administered medications on file prior to visit.   Review of Systems All otherwise neg per pt    Objective:   Physical Exam BP 114/68   Pulse 87   Temp 98.2 F (36.8 C) (Oral)   Ht 5\' 2"  (1.575 m)   Wt 140 lb (63.5 kg)   SpO2 95%   BMI 25.61 kg/m  VS noted,  Constitutional: Pt appears in NAD HENT: Head: NCAT.  Right Ear: External ear normal.  Left Ear: External ear normal.  Eyes: . Pupils are equal, round, and reactive to light. Conjunctivae and EOM are normal Nose: without d/c or deformity Neck: Neck supple. Gross normal ROM Cardiovascular: Normal rate and regular rhythm.   Pulmonary/Chest: Effort normal and breath sounds without rales or wheezing.  Abd:  Soft, NT, ND, + BS, no organomegaly Neurological: Pt is alert. At baseline orientation, motor grossly intact Skin: Skin is warm. No rashes, other new lesions, no LE edema Psychiatric: Pt behavior is normal without agitation  All otherwise neg per pt Lab Results  Component Value Date   WBC 7.1 10/11/2020   HGB 14.4 10/11/2020   HCT 43.7 10/11/2020   PLT 318.0 10/11/2020   GLUCOSE 92 10/11/2020   CHOL 187 10/11/2020   TRIG 320.0 (H) 10/11/2020  HDL 36.00 (L) 10/11/2020   LDLDIRECT 110.0 10/11/2020   LDLCALC 143 (H) 09/30/2018   ALT 24 10/11/2020   AST 16 10/11/2020   NA 142 10/11/2020   K 4.9 10/11/2020   CL 104 10/11/2020   CREATININE 0.87 10/11/2020   BUN 14 10/11/2020   CO2 32 10/11/2020   TSH 1.86 10/11/2020   PSA 0.46 10/11/2020   HGBA1C 6.4 10/11/2020      Assessment & Plan:

## 2020-10-12 ENCOUNTER — Encounter: Payer: Self-pay | Admitting: Internal Medicine

## 2020-10-12 NOTE — Assessment & Plan Note (Signed)

## 2020-10-12 NOTE — Assessment & Plan Note (Signed)
stable overall by history and exam, recent data reviewed with pt, and pt to continue medical treatment as before,  to f/u any worsening symptoms or concerns  

## 2020-10-12 NOTE — Assessment & Plan Note (Signed)
Encouraged to start the pepcid, f/u gi as needed

## 2020-10-13 ENCOUNTER — Encounter: Payer: Self-pay | Admitting: Internal Medicine

## 2020-10-14 LAB — HEPATITIS C ANTIBODY
Hepatitis C Ab: NONREACTIVE
SIGNAL TO CUT-OFF: 0.01 (ref <1.00)

## 2020-10-17 ENCOUNTER — Ambulatory Visit: Payer: BC Managed Care – PPO | Admitting: Gastroenterology

## 2020-11-05 ENCOUNTER — Ambulatory Visit (INDEPENDENT_AMBULATORY_CARE_PROVIDER_SITE_OTHER): Payer: BC Managed Care – PPO | Admitting: Gastroenterology

## 2020-11-05 ENCOUNTER — Encounter: Payer: Self-pay | Admitting: Gastroenterology

## 2020-11-05 VITALS — BP 112/70 | HR 88 | Ht 62.25 in | Wt 143.2 lb

## 2020-11-05 DIAGNOSIS — K219 Gastro-esophageal reflux disease without esophagitis: Secondary | ICD-10-CM | POA: Diagnosis not present

## 2020-11-05 DIAGNOSIS — R140 Abdominal distension (gaseous): Secondary | ICD-10-CM

## 2020-11-05 DIAGNOSIS — K31A0 Gastric intestinal metaplasia, unspecified: Secondary | ICD-10-CM | POA: Diagnosis not present

## 2020-11-05 DIAGNOSIS — K648 Other hemorrhoids: Secondary | ICD-10-CM | POA: Diagnosis not present

## 2020-11-05 NOTE — Patient Instructions (Addendum)
If you are age 52 or older, your body mass index should be between 23-30. Your Body mass index is 25.99 kg/m. If this is out of the aforementioned range listed, please consider follow up with your Primary Care Provider.  If you are age 78 or younger, your body mass index should be between 19-25. Your Body mass index is 25.99 kg/m. If this is out of the aformentioned range listed, please consider follow up with your Primary Care Provider.   You have been scheduled for an endoscopy with BRAVO placement. Please follow written instructions given to you at your visit today. If you use inhalers (even only as needed), please bring them with you on the day of your procedure.  Take a stool softener called ducosate 100mg  capsule every day to avoid constipation and hemorrhoidal bleeding.  It was a pleasure to see you today!  Dr. Loletha Carrow

## 2020-11-05 NOTE — Progress Notes (Signed)
[facility] Progress Note  Chief Complaint: GERD  Subjective  History: From my last office visit September 2019: "This is a 54 year old [place] man who saw me in mid June for chronic symptoms of GERD and bloating. Upper endoscopy on colonoscopy June 23: H. pylori gastritis with intestinal metaplasia, 2 diminutive adenomatous colon polyps. He was treated with bismuth based quadruple therapy for H. pylori.  A urea breath test was scheduled for several weeks ago, but was canceled by lab because "specimen could not be located".   He is feeling well, with about 80% improvement in reflux symptoms.  He is never really had much in the way of heartburn, so H2 blockers and PPI have not been very helpful by his report.  He still has intermittent regurgitation with sour brash. He denies dysphagia, his appetite is good and weight stable.   He is currently not taking any acid suppression."  Plan was for repeat upper endoscopy at 3-year interval and UBT to confirm H. pylori eradication (UBT was negative) 2 diminutive adenomas on colonoscopy July 2019 ________________________  He was seen by one of our APPs twice in the last 2 months for ongoing bloating and recurrence of regurgitation symptoms.  PPI was resumed.  Roger Long is feeling about the same, with chronic abdominal bloating, regurgitation and pyrosis, especially at night.  Sometimes the regurgitation makes him feel dizzy and a discomfort that radiates into his upper back.  He also had several days of painless rectal bleeding last week, which is the first time that has happened to him.  It was difficult to tell, but it sounds like he may have had some hard stool around that time.  Typically has 1 BM per day. Denies nausea vomiting or weight loss. ROS: Cardiovascular:  no chest pain Respiratory: no dyspnea Remainder of systems negative except as above The patient's Past Medical, Family and Social History were reviewed and are on file in  the EMR.  Objective:  Med list reviewed  Current Outpatient Medications:  .  calcium carbonate (TUMS - DOSED IN MG ELEMENTAL CALCIUM) 500 MG chewable tablet, Chew 1 tablet by mouth daily., Disp: , Rfl:  .  lisinopril (ZESTRIL) 10 MG tablet, TAKE 1 TABLET(10 MG) BY MOUTH DAILY, Disp: 90 tablet, Rfl: 3 .  pravastatin (PRAVACHOL) 40 MG tablet, TAKE 1 TABLET BY MOUTH DAILY, Disp: 90 tablet, Rfl: 3 .  famotidine (PEPCID) 20 MG tablet, Take 1 tablet (20 mg total) by mouth at bedtime. (Patient not taking: Reported on 11/05/2020), Disp: 30 tablet, Rfl: 2 .  omeprazole (PRILOSEC) 40 MG capsule, Take 1 capsule (40 mg total) by mouth daily. (Patient not taking: Reported on 11/05/2020), Disp: 30 capsule, Rfl: 3   Vital signs in last 24 hrs: Vitals:   11/05/20 0837  BP: 112/70  Pulse: 88    Physical Exam  Well-appearing  HEENT: sclera anicteric, oral mucosa moist without lesions  Neck: supple, no thyromegaly, JVD or lymphadenopathy  Cardiac: RRR without murmurs, S1S2 heard, no peripheral edema  Pulm: clear to auscultation bilaterally, normal RR and effort noted  Abdomen: soft, mild epigastric tenderness, with active bowel sounds. No guarding or palpable hepatosplenomegaly.  Skin; warm and dry, no jaundice or rash Rectal: Normal external, normal sphincter tone, no fissure or palpable internal lesion. Anoscopy: Internal hemorrhoids, especially RA column Labs:   ___________________________________________ Radiologic studies:   ____________________________________________ Other:   _____________________________________________ Assessment & Plan  Assessment: Encounter Diagnoses  Name Primary?  . Gastroesophageal reflux disease, unspecified whether  esophagitis present Yes  . Abdominal bloating   . Bleeding internal hemorrhoids   . Gastric intestinal metaplasia    GERD, suboptimal control on acid suppression, I feel we need to quantify the reflux to see if he might benefit from  a TIF.  Would avoid surgical fundoplication given his chronic functional bloating. Internal hemorrhoidal bleeding last week, now resolved.  Plan: EGD with surveillance biopsies for gastric intestinal metaplasia as well as placement of Bravo pH device.  This will be done with him off PPI and H2 blocker 5 days prior, and instructions were given about that. He was agreeable after discussion of procedure and risks.  The benefits and risks of the planned procedure were described in detail with the patient or (when appropriate) their health care proxy.  Risks were outlined as including, but not limited to, bleeding, infection, perforation, adverse medication reaction leading to cardiac or pulmonary decompensation, pancreatitis (if ERCP).  The limitation of incomplete mucosal visualization was also discussed.  No guarantees or warranties were given.  Daily stool softener, avoid straining and sitting for prolonged periods on toilet.  If bleeding recurs, use Preparation H suppository and contact us for consideration hemorrhoidal banding.  40 minutes were spent on this encounter (including chart review, history/exam, counseling/coordination of care, and documentation)  Nelida Meuse III

## 2020-11-20 ENCOUNTER — Encounter: Payer: Self-pay | Admitting: Gastroenterology

## 2020-11-20 ENCOUNTER — Ambulatory Visit: Payer: BC Managed Care – PPO | Admitting: Gastroenterology

## 2020-11-20 ENCOUNTER — Other Ambulatory Visit: Payer: Self-pay

## 2020-11-20 VITALS — BP 95/60 | HR 79 | Temp 98.4°F | Resp 15 | Ht 62.25 in | Wt 143.0 lb

## 2020-11-20 DIAGNOSIS — K31A0 Gastric intestinal metaplasia, unspecified: Secondary | ICD-10-CM

## 2020-11-20 DIAGNOSIS — K2950 Unspecified chronic gastritis without bleeding: Secondary | ICD-10-CM | POA: Diagnosis not present

## 2020-11-20 DIAGNOSIS — K219 Gastro-esophageal reflux disease without esophagitis: Secondary | ICD-10-CM | POA: Diagnosis not present

## 2020-11-20 MED ORDER — SODIUM CHLORIDE 0.9 % IV SOLN: 500.0000 mL | Freq: Once | INTRAVENOUS | Status: DC

## 2020-11-20 NOTE — Progress Notes (Signed)
PT taken to PACU. Monitors in place. VSS. Report given to RN. 

## 2020-11-20 NOTE — Progress Notes (Signed)
VS- [employer] used today at the [facility]  Pt and daughter shown the post-procedure instructions- they both have watched the video and have no questions at this time  Recording diary given to daughter in admitting  Called to room to assist during endoscopic procedure.  Patient ID and intended procedure confirmed with present staff. Received instructions for my participation in the procedure from the performing physician.   Capsule expiration date- 03-27-22 Capsule ID number [id number]  LES measurement: 36 (capsule placed 6 cm above LES) 30  Time of implant: 909

## 2020-11-20 NOTE — Patient Instructions (Addendum)
Resume previous diet  continue current medications except PPI's for 48 hours Await pathology results Return to office after studies completed  YOU HAD AN ENDOSCOPIC PROCEDURE TODAY AT [place]:   Refer to the procedure report that was given to you for any specific questions about what was found during the examination.  If the procedure report does not answer your questions, please call your gastroenterologist to clarify.  If you requested that your care partner not be given the details of your procedure findings, then the procedure report has been included in a sealed envelope for you to review at your convenience later.  YOU SHOULD EXPECT: Some feelings of bloating in the abdomen. Passage of more gas than usual.  Walking can help get rid of the air that was put into your GI tract during the procedure and reduce the bloating. If you had a lower endoscopy (such as a colonoscopy or flexible sigmoidoscopy) you may notice spotting of blood in your stool or on the toilet paper. If you underwent a bowel prep for your procedure, you may not have a normal bowel movement for a few days.  Please Note:  You might notice some irritation and congestion in your nose or some drainage.  This is from the oxygen used during your procedure.  There is no need for concern and it should clear up in a day or so.  SYMPTOMS TO REPORT IMMEDIATELY:   Following upper endoscopy (EGD)  Vomiting of blood or coffee ground material  New chest pain or pain under the shoulder blades  Painful or persistently difficult swallowing  New shortness of breath  Fever of 100F or higher  Black, tarry-looking stools  For urgent or emergent issues, a gastroenterologist can be reached at any hour by calling [phone]. Do not use MyChart messaging for urgent concerns.   DIET:  We do recommend a small meal at first, but then you may proceed to your regular diet.  Drink plenty of fluids but you should avoid  alcoholic beverages for 24 hours.  ACTIVITY:  You should plan to take it easy for the rest of today and you should NOT DRIVE or use heavy machinery until tomorrow (because of the sedation medicines used during the test).    FOLLOW UP: Our staff will call the number listed on your records 48-72 hours following your procedure to check on you and address any questions or concerns that you may have regarding the information given to you following your procedure. If we do not reach you, we will leave a message.  We will attempt to reach you two times.  During this call, we will ask if you have developed any symptoms of COVID 19. If you develop any symptoms (ie: fever, flu-like symptoms, shortness of breath, cough etc.) before then, please call [phone].  If you test positive for Covid 19 in the 2 weeks post procedure, please call and report this information to [place].    If any biopsies were taken you will be contacted by phone or by letter within the next 1-3 weeks.  Please call us at [phone] if you have not heard about the biopsies in 3 weeks.   SIGNATURES/CONFIDENTIALITY: You and/or your care partner have signed paperwork which will be entered into your electronic medical record.  These signatures attest to the fact that that the information above on your After Visit Summary has been reviewed and is understood.  Full responsibility of the confidentiality of this discharge information  lies with you and/or your care-partner.Post-op Bravo pH instructions Once you get home:  Eat normally and go about your daily routine/activities Limit drinking fluids or eating between meals Do not chew gum or eat hard candy DO NOT take any antacid or anti-reflux medications during the 48-hour monitoring time, unless instructed by your physician  Recording events: Events to be recorded are:  Record using event buttons on recorder and write on paper diary form 1. Every time you eat or drink something (other than  water) 2.   Periods of lying down/reclining 3.  Symptoms:  may include heartburn, regurgitation, chest pain, cough or specify if other.  A paper diary is also provided to record the times of your reflux symptoms and times for meals and when you lie down.  The recorder needs to remain within 3 feet (arms length) of you during the testing period (48 hours). If you should forget and move outside of a 3-foot radius of the receiver you may hear beeping and you will see a "C1" error in the display window on the top of the receiver.  Please pick up the receiver and hold close to you to re-establish the connection and the error message disappears.  You may take a bath/shower during the testing period, but the recorder must not get wet and must remain within 3 feet of you. Please leave the receiver outside of the shower or tub while bathing. The monitoring period will be for 48 hours after placement of the capsule.  At the end of the 48 hours, you will return the recorder, and your diary, to our [facility] front desk.  A nurse will meet you to collect the device and answer any questions you may have.  The device should turn off once the 48 hours is complete.   What to expect after placement of the capsule:  Some patients experience a vague sensation that something is in their esophagus or that they 'feel' the capsule when they swallow food.  Should you experience this, chewing food carefully or drinking liquids may minimize this sensation.   After the test is complete, the disposable capsule will fall off the wall of your esophagus within 5-10 days and pass naturally with your bowel movement through the digestive tract.  Once the recorder is returned, your provider will review and interpret your recordings and contact you to discuss your results.  This may take up to two weeks.   DO NOT have an MRI for 30 days after your procedure to ensure the capsule is no longer inside your body  It  is imperative that you return the recorder on _________________________ by 3:00pm.  Your information must be downloaded at this time to obtain your results.

## 2020-11-20 NOTE — Op Note (Signed)
[facility] Patient Name: Roger Long Procedure Date: 11/20/2020 8:16 AM MRN: [id number] Endoscopist: Mallie Mussel L. Loletha Carrow , MD Age: 54 Referring MD:  Date of Birth: 03-26-1966 Gender: Male Account #: [id number] Procedure:                Upper GI endoscopy Indications:              Heartburn, Follow-up of gastric intestinal                            metaplasia (discovered 06/2018), GERD symptoms                            recurring despite medication Medicines:                Monitored Anesthesia Care Procedure:                Pre-Anesthesia Assessment:                           - Prior to the procedure, a History and Physical                            was performed, and patient medications and                            allergies were reviewed. The patient's tolerance of                            previous anesthesia was also reviewed. The risks                            and benefits of the procedure and the sedation                            options and risks were discussed with the patient.                            All questions were answered, and informed consent                            was obtained. Prior Anticoagulants: The patient has                            taken no previous anticoagulant or antiplatelet                            agents. ASA Grade Assessment: II - A patient with                            mild systemic disease. After reviewing the risks                            and benefits, the patient was deemed in  satisfactory condition to undergo the procedure.                           After obtaining informed consent, the endoscope was                            passed under direct vision. Throughout the                            procedure, the patient's blood pressure, pulse, and                            oxygen saturations were monitored continuously. The                            Endoscope was introduced through the mouth,  and                            advanced to the second part of duodenum. The upper                            GI endoscopy was accomplished without difficulty.                            The patient tolerated the procedure well. Scope In: Scope Out: Findings:                 The examined esophagus was normal. The BRAVO                            capsule with delivery system was introduced through                            the mouth and advanced into the esophagus, such                            that the BRAVO pH capsule was positioned 30 cm from                            the incisors, which was 6 cm proximal to the GE                            junction. The BRAVO pH capsule was then deployed                            and attached to the esophageal mucosa. The delivery                            system was then withdrawn. Endoscopy was utilized                            for probe placement and diagnostic evaluation. The  scope was reinserted to evaluate placement of the                            BRAVO capsule. Visualization showed the BRAVO                            capsule to be in an appropriate position.                           Patchy atrophic mucosa was found in the gastric                            antrum. Entire stomach examined under WL and NBI,                            with no raised or suspicious areas identified. IM                            surveillance biopsies were taken with a cold                            forceps for histology. ([place] protocol).                           The exam of the stomach was otherwise normal.                           The examined duodenum was normal. Complications:            No immediate complications. Estimated Blood Loss:     Estimated blood loss was minimal. Impression:               - Normal esophagus.                           - Gastric mucosal atrophy. Biopsied.                           - Normal  examined duodenum.                           - The BRAVO pH capsule was deployed. Recommendation:           - Patient has a contact number available for                            emergencies. The signs and symptoms of potential                            delayed complications were discussed with the                            patient. Return to normal activities tomorrow.                            Written discharge instructions were  provided to the                            patient.                           - Resume previous diet.                           - Continue present medications. (resume PPI after                            48 hours)                           - Await pathology results.                           - Return to my office after studies are complete. Bobbi Yount L. Loletha Carrow, MD 11/20/2020 9:21:01 AM This report has been signed electronically.

## 2020-11-22 ENCOUNTER — Telehealth: Payer: Self-pay | Admitting: *Deleted

## 2020-11-22 NOTE — Telephone Encounter (Signed)
  Follow up Call-  Call back number 11/20/2020 07/05/2018  Post procedure Call Back phone  # [phone]  Permission to leave phone message Yes Yes  Some recent data might be hidden     Patient questions:  Do you have a fever, pain , or abdominal swelling? No. Pain Score  0 *  Have you tolerated food without any problems? Yes.    Have you been able to return to your normal activities? Yes.    Do you have any questions about your discharge instructions: Diet   No. Medications  No. Follow up visit  No.  Do you have questions or concerns about your Care? No.  Actions: * If pain score is 4 or above: No action needed, pain <4.  1. Have you developed a fever since your procedure? no  2.   Have you had an respiratory symptoms (SOB or cough) since your procedure? no  3.   Have you tested positive for COVID 19 since your procedure no  4.   Have you had any family members/close contacts diagnosed with the COVID 19 since your procedure?  no   If yes to any of these questions please route to Joylene John, RN and Joella Prince, RN

## 2020-11-26 ENCOUNTER — Encounter: Payer: Self-pay | Admitting: Gastroenterology

## 2020-12-24 ENCOUNTER — Ambulatory Visit: Payer: BC Managed Care – PPO | Admitting: Gastroenterology

## 2021-01-21 ENCOUNTER — Other Ambulatory Visit: Payer: Self-pay

## 2021-01-21 ENCOUNTER — Ambulatory Visit (INDEPENDENT_AMBULATORY_CARE_PROVIDER_SITE_OTHER): Payer: BC Managed Care – PPO | Admitting: Gastroenterology

## 2021-01-21 ENCOUNTER — Encounter: Payer: Self-pay | Admitting: Gastroenterology

## 2021-01-21 VITALS — BP 96/60 | HR 100 | Ht 62.0 in | Wt 141.0 lb

## 2021-01-21 DIAGNOSIS — K31A0 Gastric intestinal metaplasia, unspecified: Secondary | ICD-10-CM | POA: Diagnosis not present

## 2021-01-21 DIAGNOSIS — R12 Heartburn: Secondary | ICD-10-CM

## 2021-01-21 DIAGNOSIS — R140 Abdominal distension (gaseous): Secondary | ICD-10-CM

## 2021-01-21 MED ORDER — AMITRIPTYLINE HCL 10 MG PO TABS: 10.0000 mg | ORAL_TABLET | Freq: Every day | ORAL | 0 refills | Status: DC

## 2021-01-21 NOTE — Progress Notes (Addendum)
[facility] Progress Note  Chief Complaint: Heartburn  Subjective  History: Last seen in the office 11/05/2020 with chronic bloating regurgitation and pyrosis despite acid suppression therapy. He underwent upper endoscopy with wireless pH device placement on 11/20/2020.  The study was done with him off acid suppression 5 days prior.  Endoscopy revealed no esophagitis or hiatal hernia.  He also had endoscopic surveillance for previously diagnosed gastric intestinal metaplasia. pH device was placed without difficulty.  Roger Long has similar symptoms to before, with intermittent feelings of regurgitation that will radiate into his shoulders and make him feel dizzy.  He has chronic bloating as before.  The symptoms do not seem as bad in the last couple of months since his endoscopy.  Sometimes he feels a lump in the throat, he does not get dysphagia or food impaction it is difficult for him to tell if acid suppression has helped his symptoms much.  ROS: Cardiovascular:  no chest pain Respiratory: no dyspnea  The patient's Past Medical, Family and Social History were reviewed and are on file in the EMR.  Objective:  Med list reviewed  Current Outpatient Medications:  .  calcium carbonate (TUMS - DOSED IN MG ELEMENTAL CALCIUM) 500 MG chewable tablet, Chew 1 tablet by mouth daily., Disp: , Rfl:  .  famotidine (PEPCID) 20 MG tablet, Take 1 tablet (20 mg total) by mouth at bedtime., Disp: 30 tablet, Rfl: 2 .  lisinopril (ZESTRIL) 10 MG tablet, TAKE 1 TABLET(10 MG) BY MOUTH DAILY, Disp: 90 tablet, Rfl: 3 .  pravastatin (PRAVACHOL) 40 MG tablet, TAKE 1 TABLET BY MOUTH DAILY, Disp: 90 tablet, Rfl: 3   Vital signs in last 24 hrs: Vitals:   01/21/21 1040  BP: 96/60  Pulse: 100   Wt Readings from Last 3 Encounters:  01/21/21 141 lb (64 kg)  11/20/20 143 lb (64.9 kg)  11/05/20 143 lb 4 oz (65 kg)    Physical Exam  Well-appearing, normal vocal quality  HEENT: sclera anicteric, oral  mucosa moist without lesions  Neck: supple, no thyromegaly, JVD or lymphadenopathy  Cardiac: RRR without murmurs, S1S2 heard, no peripheral edema  Pulm: clear to auscultation bilaterally, normal RR and effort noted  Abdomen: soft, no tenderness, with active bowel sounds. No guarding or palpable hepatosplenomegaly.  Skin; warm and dry, no jaundice or rash  Labs:  Full Bravo report still pending, my review of the data shows very low DeMeester score of 3.1 and 3.2 days 1 and 2 respectively ___________________________________________ Radiologic studies:   ____________________________________________ Other: 1. Surgical [P], gastric antrum, hx gastric intestinal metaplasia - ANTRAL MUCOSA WITH MILD CHRONIC INFLAMMATION. [place] NEGATIVE FOR HELICOBACTER PYLORI. - NO INTESTINAL METAPLASIA, DYSPLASIA OR CARCINOMA. 2. Surgical [P], stomach, gastric incisura, hx gastric intestinal metaplasia - OXYNTIC MUCOSA WITH MILD CHRONIC INFLAMMATION. [place] NEGATIVE FOR HELICOBACTER PYLORI. - NO INTESTINAL METAPLASIA, DYSPLASIA OR CARCINOMA. 3. Surgical [P], stomach, lesser curve gastric body, hx gastric intestinal metaplasia - OXYNTIC MUCOSA WITH MILD CHRONIC INFLAMMATION. [place] NEGATIVE FOR HELICOBACTER PYLORI. - NO INTESTINAL METAPLASIA, DYSPLASIA OR CARCINOMA. 4. Surgical [P], stomach, gastric body greater curve, hx gastric instestinal metaplasia - OXYNTIC MUCOSA WITH MILD CHRONIC INFLAMMATION. [place] NEGATIVE FOR HELICOBACTER PYLORI. - NO INTESTINAL METAPLASIA, DYSPLASIA OR CARCINOMA.  _____________________________________________ Assessment & Plan  Assessment: Encounter Diagnoses  Name Primary?  . Heartburn Yes  . Abdominal bloating   . Gastric intestinal metaplasia    He has a physiologic amount of reflux demonstrated on this pH study.  Therefore, I think his symptoms are functional in nature, and probably has an element of visceral  hypersensitivity to the reflux episodes that do occur.  It was somewhat challenging to explain that, perhaps due to some language barrier, but I believe he understood.  He does not have hiatal hernia, erosive esophagitis or Barrett's esophagus. Fortunately, no intestinal metaplasia found on this exam but he will need ongoing surveillance because it has been discovered before.  Plan: Amitriptyline 10 mg QHS.  He was instructed to call us if he seems to have any side effects such as dry mouth, blurred vision, palpitations or dizziness.  It may make him fatigued, which is why it is dosed at bedtime  I asked him to send [place] a message by portal or call to the nurse in 2 weeks with an update.  As long as he does not seem to be having side effects, I will probably cautiously and slowly increase the dose.  If it does seem to help him, then I would like to get him off acid suppression therapy.  Office visit in 6 weeks.  Surveillance upper endoscopy for gastric intestinal metaplasia in 3 years.  Letter was sent to him after his endoscopy and biopsy results.  30 minutes were spent on this encounter (including chart review, history/exam, counseling/coordination of care, and documentation) > 50% of that time was spent on counseling and coordination of care.  Topics discussed included: Nonerosive reflux and its treatment, use of acid suppression and long-term risks, gastrointestinal metaplasia and cancer risk.  Nelida Meuse III

## 2021-01-21 NOTE — Patient Instructions (Addendum)
If you are age 53 or older, your body mass index should be between 23-30. Your Body mass index is 25.79 kg/m. If this is out of the aforementioned range listed, please consider follow up with your Primary Care Provider.  If you are age 13 or younger, your body mass index should be between 19-25. Your Body mass index is 25.79 kg/m. If this is out of the aformentioned range listed, please consider follow up with your Primary Care Provider.   Follow up in 6 weeks 03-11-2021 at  2:20pm  Please send a my chart message in 2 weeks with an update after starting amitriptyline.  It was a pleasure to see you today!  Dr. Loletha Carrow

## 2021-02-06 ENCOUNTER — Other Ambulatory Visit: Payer: Self-pay | Admitting: Gastroenterology

## 2021-02-06 MED ORDER — AMITRIPTYLINE HCL 10 MG PO TABS
20.0000 mg | ORAL_TABLET | Freq: Every day | ORAL | 0 refills | Status: DC
Start: 2021-02-06 — End: 2021-03-05

## 2021-03-05 ENCOUNTER — Other Ambulatory Visit: Payer: Self-pay | Admitting: Gastroenterology

## 2021-03-11 ENCOUNTER — Ambulatory Visit (INDEPENDENT_AMBULATORY_CARE_PROVIDER_SITE_OTHER): Payer: BC Managed Care – PPO | Admitting: Gastroenterology

## 2021-03-11 ENCOUNTER — Encounter: Payer: Self-pay | Admitting: Gastroenterology

## 2021-03-11 ENCOUNTER — Other Ambulatory Visit: Payer: Self-pay

## 2021-03-11 VITALS — BP 110/80 | HR 108 | Ht 62.0 in | Wt 140.2 lb

## 2021-03-11 DIAGNOSIS — R12 Heartburn: Secondary | ICD-10-CM

## 2021-03-11 DIAGNOSIS — R140 Abdominal distension (gaseous): Secondary | ICD-10-CM | POA: Diagnosis not present

## 2021-03-11 NOTE — Patient Instructions (Addendum)
If you are age 53 or older, your body mass index should be between 23-30. Your Body mass index is 25.64 kg/m. If this is out of the aforementioned range listed, please consider follow up with your Primary Care Provider.  If you are age 37 or younger, your body mass index should be between 19-25. Your Body mass index is 25.64 kg/m. If this is out of the aformentioned range listed, please consider follow up with your Primary Care Provider.   Stop amitriptyline  Increase the famotidine to 40 mg at bedtime Use Maloxx as needed for overnight symptoms   Due to recent COVID-19 restrictions implemented by our local and state authorities and in an effort to keep both patients and staff as safe as possible, our hospital system now requires COVID-19 testing prior to any scheduled hospital procedure. Please go to [facility], [place], [place] [address] on 03-18-2021 at  10:10 am . This is a drive up testing site, you will not need to exit your vehicle.  You will not be billed at the time of testing but may receive a bill later depending on your insurance. The approximate cost of the test is $100. You must agree to quarantine from the time of your testing until the procedure date on 03-21-2021 . This should include staying at home with ONLY the people you live with. Avoid take-out, grocery store shopping or leaving the house for any non-emergent reason. Failure to have your COVID-19 test done on the date and time you have been scheduled will result in cancellation of procedure. Please call our office at [phone] if you have any questions.   You have been scheduled for an esophageal manometry test at [facility] on 03-21-2021 at 1230PM. Please arrive 30 minutes prior to your procedure for registration. You will need to go to outpatient registration (1st floor of the hospital) first. Make certain to bring your insurance cards as well as a complete list of medications.   Please remember the  following:  1) Do not take any muscle relaxants, xanax (alprazolam) or ativan for 1 day prior to your test as well as the day of the test.  2) Nothing to eat or drink after 12:00 midnight on the night before your test.  3) Hold all diabetic medications/insulin the morning of the test. You may eat and take your medications after the test.  It will take at least 2 weeks to receive the results of this test from your physician.  ------------------------------------------ ABOUT ESOPHAGEAL MANOMETRY Esophageal manometry (muh-NOM-uh-tree) is a test that gauges how well your esophagus works. Your esophagus is the long, muscular tube that connects your throat to your stomach. Esophageal manometry measures the rhythmic muscle contractions (peristalsis) that occur in your esophagus when you swallow. Esophageal manometry also measures the coordination and force exerted by the muscles of your esophagus.  During esophageal manometry, a thin, flexible tube (catheter) that contains sensors is passed through your nose, down your esophagus and into your stomach. Esophageal manometry can be helpful in diagnosing some mostly uncommon disorders that affect your esophagus.  Why it's done Esophageal manometry is used to evaluate the movement (motility) of food through the esophagus and into the stomach. The test measures how well the circular bands of muscle (sphincters) at the top and bottom of your esophagus open and close, as well as the pressure, strength and pattern of the wave of esophageal muscle contractions that moves food along.  What you can expect Esophageal manometry is  an outpatient procedure done without sedation. Most people tolerate it well. You may be asked to change into a hospital gown before the test starts.  During esophageal manometry  . While you are sitting up, a member of your health care team sprays your throat with a numbing medication or puts numbing gel in your nose or both.  . A catheter  is guided through your nose into your esophagus. The catheter may be sheathed in a water-filled sleeve. It doesn't interfere with your breathing. However, your eyes may water, and you may gag. You may have a slight nosebleed from irritation.  . After the catheter is in place, you may be asked to lie on your back on an exam table, or you may be asked to remain seated.  . You then swallow small sips of water. As you do, a computer connected to the catheter records the pressure, strength and pattern of your esophageal muscle contractions.  . During the test, you'll be asked to breathe slowly and smoothly, remain as still as possible, and swallow only when you're asked to do so.  . A member of your health care team may move the catheter down into your stomach while the catheter continues its measurements.  . The catheter then is slowly withdrawn. The test usually lasts 20 to 30 minutes.  After esophageal manometry  When your esophageal manometry is complete, you may return to your normal activities  This test typically takes 30-45 minutes to complete. ________________________________________________________________________________

## 2021-03-11 NOTE — Progress Notes (Signed)
[facility] Progress Note  Chief Complaint: Heartburn  Subjective  History:  [place] follows up for his reflux symptoms.  At last visit his pH studies were reviewed and it was noted that he had little or no demonstrable reflux on Bravo study done while off meds.  I suspect that he had functional bloating and heartburn and started amitriptyline at a low dose and then slowly increase.  He initially sent a portal message saying it was helping, so increase the dose.  Now he tells me that after couple of weeks it no longer seem to be helping.  There is still some difficulty with language barrier as before, and is near as I can tell his primary complaint right now is nocturnal regurgitation.  He will take Pepcid 20 mg at night and sometimes take another dose overnight when he wakes up with feelings of stomach contents regurgitating and causing a fullness in the throat and dizziness.   ROS: Cardiovascular:  no chest pain Respiratory: no dyspnea Remainder of systems negative except as above The patient's Past Medical, Family and Social History were reviewed and are on file in the EMR.  Objective:  Med list reviewed  Current Outpatient Medications:  .  amitriptyline (ELAVIL) 10 MG tablet, TAKE 2 TABLETS(20 MG) BY MOUTH AT BEDTIME, Disp: 60 tablet, Rfl: 0 .  calcium carbonate (TUMS - DOSED IN MG ELEMENTAL CALCIUM) 500 MG chewable tablet, Chew 1 tablet by mouth daily., Disp: , Rfl:  .  famotidine (PEPCID) 20 MG tablet, Take 1 tablet (20 mg total) by mouth at bedtime., Disp: 30 tablet, Rfl: 2 .  lisinopril (ZESTRIL) 10 MG tablet, TAKE 1 TABLET(10 MG) BY MOUTH DAILY, Disp: 90 tablet, Rfl: 3 .  pravastatin (PRAVACHOL) 40 MG tablet, TAKE 1 TABLET BY MOUTH DAILY, Disp: 90 tablet, Rfl: 3   Vital signs in last 24 hrs: Vitals:   03/11/21 1413  BP: 110/80  Pulse: (!) 108   Wt Readings from Last 3 Encounters:  03/11/21 140 lb 3.2 oz (63.6 kg)  01/21/21 141 lb (64 kg)  11/20/20 143 lb (64.9  kg)    Physical Exam  Well-appearing, normal vocal quality  HEENT: sclera anicteric, oral mucosa moist without lesions  Neck: supple, no thyromegaly, JVD or lymphadenopathy  Cardiac: RRR without murmurs, S1S2 heard, no peripheral edema  Pulm: clear to auscultation bilaterally, normal RR and effort noted  Abdomen: soft, no tenderness, with active bowel sounds. No guarding or palpable hepatosplenomegaly.  Skin; warm and dry, no jaundice or rash  Labs:   ___________________________________________ Radiologic studies:   ____________________________________________ Other:   _____________________________________________ Assessment & Plan  Assessment: Encounter Diagnoses  Name Primary?  . Heartburn Yes  . Abdominal bloating    I still suspect that there is a significant component of functional dyspepsia and heartburn.  Amitriptyline seem to help a little at first but seemed to lose its effect, so we will discontinue it. Curiously he did not seem to get much improvement from PPI, or perhaps it was just difficult to get a clear and consistent history. His current symptoms seem to mainly be nocturnal regurgitation occurring 2 or 3 nights a week.  They seem to be quite distressing to him, and perhaps they just were not picked up on a 48-hour Bravo study.  Perhaps he has occasional nonacidic reflux the Bravo pH study might not detect.   Plan: He currently has last food about a p.m., so I have asked him to change eating pattern with  last meal 4 hours before his usual bedtime at 10 PM.  Increase Pepcid to 40 mg nightly  Elevate head of bed  Esophageal manometry and pH with impedance study.  This will be done with patient on current acid suppression therapy.  If that also does not demonstrate significant reflux, then it will be clear that medicines are not likely to help much for these symptoms.  30 minutes were spent on this encounter (including chart review, history/exam,  counseling/coordination of care, and documentation) > 50% of that time was spent on counseling and coordination of care.  Topics discussed included: GERD and testing.  Nelida Meuse III

## 2021-03-13 ENCOUNTER — Other Ambulatory Visit: Payer: Self-pay

## 2021-03-13 DIAGNOSIS — K219 Gastro-esophageal reflux disease without esophagitis: Secondary | ICD-10-CM

## 2021-03-18 ENCOUNTER — Other Ambulatory Visit (HOSPITAL_COMMUNITY)
Admission: RE | Admit: 2021-03-18 | Discharge: 2021-03-18 | Disposition: A | Discharge status: Home / Self Care | Payer: BC Managed Care – PPO | Source: Ambulatory Visit | Attending: Gastroenterology | Admitting: Gastroenterology

## 2021-03-18 DIAGNOSIS — Z20822 Contact with and (suspected) exposure to covid-19: Secondary | ICD-10-CM | POA: Diagnosis not present

## 2021-03-18 DIAGNOSIS — Z01812 Encounter for preprocedural laboratory examination: Secondary | ICD-10-CM | POA: Insufficient documentation

## 2021-03-18 LAB — SARS CORONAVIRUS 2 (TAT 6-24 HRS): SARS Coronavirus 2: NEGATIVE

## 2021-03-21 ENCOUNTER — Encounter (HOSPITAL_COMMUNITY)
Admission: RE | Disposition: A | Discharge status: Home / Self Care | Payer: Self-pay | Source: Home / Self Care | Attending: Gastroenterology

## 2021-03-21 ENCOUNTER — Ambulatory Visit (HOSPITAL_COMMUNITY)
Admission: RE | Admit: 2021-03-21 | Discharge: 2021-03-21 | Disposition: A | Discharge status: Home / Self Care | Payer: BC Managed Care – PPO | Attending: Internal Medicine | Admitting: Internal Medicine

## 2021-03-21 SURGERY — MANOMETRY, ESOPHAGUS

## 2021-03-21 MED ORDER — LIDOCAINE VISCOUS HCL 2 % MT SOLN
OROMUCOSAL | Status: AC
  Filled 2021-03-21: qty 15

## 2021-03-24 ENCOUNTER — Encounter (HOSPITAL_COMMUNITY): Payer: Self-pay | Admitting: Gastroenterology

## 2021-03-24 ENCOUNTER — Ambulatory Visit (HOSPITAL_COMMUNITY)
Admission: RE | Admit: 2021-03-24 | Discharge: 2021-03-24 | Disposition: A | Discharge status: Home / Self Care | Payer: BC Managed Care – PPO | Attending: Gastroenterology | Admitting: Gastroenterology

## 2021-03-24 ENCOUNTER — Encounter (HOSPITAL_COMMUNITY)
Admission: RE | Disposition: A | Discharge status: Home / Self Care | Payer: Self-pay | Source: Home / Self Care | Attending: Gastroenterology

## 2021-03-24 DIAGNOSIS — K219 Gastro-esophageal reflux disease without esophagitis: Secondary | ICD-10-CM | POA: Insufficient documentation

## 2021-03-24 HISTORY — PX: 24 HOUR PH STUDY: SHX5419

## 2021-03-24 HISTORY — PX: ESOPHAGEAL MANOMETRY: SHX5429

## 2021-03-24 SURGERY — MANOMETRY, ESOPHAGUS
Anesthesia: Moderate Sedation

## 2021-03-24 MED ORDER — LIDOCAINE VISCOUS HCL 2 % MT SOLN
OROMUCOSAL | Status: AC
  Filled 2021-03-24: qty 15

## 2021-03-24 SURGICAL SUPPLY — 2 items
FACESHIELD LNG OPTICON STERILE (SAFETY) IMPLANT
GLOVE BIO SURGEON STRL SZ8 (GLOVE) ×4 IMPLANT

## 2021-03-24 NOTE — Discharge Instructions (Signed)
    [facility] [address] [place], [place]  [address] Phone:  [phone]   Patient: Roger Long  Date of Birth: 07/23/1966  Date of Visit: 03/21/2021   To Whom It May Concern:  [name] was seen and treated on 03/21/2021 and    .      Sophia Terlizzi accompanied her father to this procedure today.      Sincerely,     Treatment Team:  Attending Provider: Doran Stabler, MD

## 2021-03-24 NOTE — Progress Notes (Signed)
Esophageal manometry performed per protocol.  Patient tolerated procedure without any diffuculties.  PH probe then placed at 36 cm at left nare.  Written and verbal education provided on use of equipment and when to return to Endoscopy to have probe removed.  Patient verbalized understanding of all instructions.  Report to be sent to Dr. Harl Bowie.

## 2021-03-25 ENCOUNTER — Encounter (HOSPITAL_COMMUNITY): Payer: Self-pay | Admitting: Gastroenterology

## 2021-04-03 ENCOUNTER — Other Ambulatory Visit: Payer: Self-pay | Admitting: Gastroenterology

## 2021-06-11 ENCOUNTER — Encounter: Payer: Self-pay | Admitting: Internal Medicine

## 2021-06-11 ENCOUNTER — Ambulatory Visit: Payer: BC Managed Care – PPO | Admitting: Internal Medicine

## 2021-06-11 ENCOUNTER — Other Ambulatory Visit: Payer: Self-pay

## 2021-06-11 VITALS — BP 130/76 | HR 88 | Temp 99.5°F | Ht 62.0 in | Wt 138.2 lb

## 2021-06-11 DIAGNOSIS — J069 Acute upper respiratory infection, unspecified: Secondary | ICD-10-CM | POA: Diagnosis not present

## 2021-06-11 DIAGNOSIS — E559 Vitamin D deficiency, unspecified: Secondary | ICD-10-CM

## 2021-06-11 DIAGNOSIS — E538 Deficiency of other specified B group vitamins: Secondary | ICD-10-CM

## 2021-06-11 DIAGNOSIS — E785 Hyperlipidemia, unspecified: Secondary | ICD-10-CM

## 2021-06-11 DIAGNOSIS — R519 Headache, unspecified: Secondary | ICD-10-CM | POA: Diagnosis not present

## 2021-06-11 DIAGNOSIS — Z0000 Encounter for general adult medical examination without abnormal findings: Secondary | ICD-10-CM

## 2021-06-11 DIAGNOSIS — I10 Essential (primary) hypertension: Secondary | ICD-10-CM

## 2021-06-11 DIAGNOSIS — R7302 Impaired glucose tolerance (oral): Secondary | ICD-10-CM

## 2021-06-11 MED ORDER — AZITHROMYCIN 250 MG PO TABS: ORAL_TABLET | ORAL | 1 refills | Status: AC

## 2021-06-11 MED ORDER — PRAVASTATIN SODIUM 40 MG PO TABS: ORAL_TABLET | ORAL | 3 refills | Status: DC

## 2021-06-11 MED ORDER — IBUPROFEN 800 MG PO TABS: 800.0000 mg | ORAL_TABLET | Freq: Three times a day (TID) | ORAL | 1 refills | Status: DC | PRN

## 2021-06-11 NOTE — Patient Instructions (Signed)
Please take all new medication as prescribed   - the antibiotic, and pain medication  Please continue all other medications as before, and refills have been done if requested.  Please have the pharmacy call with any other refills you may need.  Please keep your appointments with your specialists as you may have planned  Please make an Appointment to return in Oct 17 2021 as you have planned, with labs done a few days before

## 2021-06-11 NOTE — Progress Notes (Signed)
Patient ID: Roger Long, male   DOB: 05-17-1966, 55 y.o.   MRN: [id number]        Chief Complaint: follow up sinus symptoms, hld, hyperglycemia       HPI:  Roger Long is a 54 y.o. male here with c/o 2-3 days acute onset fever, left ear and facial pain, pressure, headache, dizziness, general weakness and malaise, and greenish d/c, with mild ST and cough, but pt denies chest pain, wheezing, increased sob or doe, orthopnea, PND, increased LE swelling, palpitations, or syncope.   Pt denies polydipsia, polyuria, or new focal neuro s/s.    Pt denies wt loss, night sweats, loss of appetite, or other constitutional symptoms Has not been taking the statin but was tolerating ok and willing to restart.  S/p covid x 2 and booster.  Had neg home covid testing 2 days ago       Wt Readings from Last 3 Encounters:  06/11/21 138 lb 3.2 oz (62.7 kg)  03/11/21 140 lb 3.2 oz (63.6 kg)  01/21/21 141 lb (64 kg)   BP Readings from Last 3 Encounters:  06/11/21 130/76  03/11/21 110/80  01/21/21 96/60         Past Medical History:  Diagnosis Date   ALLERGIC RHINITIS 07/19/2009   BACK PAIN 07/19/2009   GERD 08/06/2010   H. pylori infection    Headache(784.0) 08/06/2010   HTN (hypertension) 10/06/2019   HYPERLIPIDEMIA 08/06/2010   Hypertension    Impaired glucose tolerance 09/26/2011   PLANTAR FASCIITIS 07/19/2009   Past Surgical History:  Procedure Laterality Date   24 HOUR [place] STUDY N/A 03/24/2021   Procedure: 24 HOUR PH STUDY;  Surgeon: Doran Stabler, MD;  Location: [facility];  Service: Gastroenterology;  Laterality: N/A;   ESOPHAGEAL MANOMETRY N/A 03/24/2021   Procedure: ESOPHAGEAL MANOMETRY (EM);  Surgeon: Doran Stabler, MD;  Location: [facility];  Service: Gastroenterology;  Laterality: N/A;   WISDOM TOOTH EXTRACTION      reports that he has quit smoking. He has never used smokeless tobacco. He reports previous alcohol use. He reports that he does not use drugs. family history includes Hypertension  in his mother; Stroke in his mother. No Known Allergies Current Outpatient Medications on File Prior to Visit  Medication Sig Dispense Refill   amitriptyline (ELAVIL) 10 MG tablet TAKE 2 TABLETS(20 MG) BY MOUTH AT BEDTIME 60 tablet 0   calcium carbonate (TUMS - DOSED IN MG ELEMENTAL CALCIUM) 500 MG chewable tablet Chew 1 tablet by mouth daily.     famotidine (PEPCID) 20 MG tablet Take 1 tablet (20 mg total) by mouth at bedtime. 30 tablet 2   lisinopril (ZESTRIL) 10 MG tablet TAKE 1 TABLET(10 MG) BY MOUTH DAILY 90 tablet 3   No current facility-administered medications on file prior to visit.        ROS:  All others reviewed and negative.  Objective        PE:  BP 130/76 (BP Location: Right Arm, Patient Position: Sitting, Cuff Size: Normal)   Pulse 88   Temp 99.5 F (37.5 C) (Oral)   Ht 5\' 2"  (1.575 m)   Wt 138 lb 3.2 oz (62.7 kg)   SpO2 98%   BMI 25.28 kg/m                 Constitutional: Pt appears in NAD               HENT: Head: NCAT.  Right Ear: External ear normal.                 Left Ear: External ear normal. ; Bilat tm's with mild erythema.  Max sinus areas mild tender.  Pharynx with mild erythema, no exudate               Eyes: . Pupils are equal, round, and reactive to light. Conjunctivae and EOM are normal               Nose: without d/c or deformity               Neck: Neck supple. Gross normal ROM               Cardiovascular: Normal rate and regular rhythm.                 Pulmonary/Chest: Effort normal and breath sounds without rales or wheezing.                               Neurological: Pt is alert. At baseline orientation, motor grossly intact               Skin: Skin is warm. No rashes, no other new lesions, LE edema - none               Psychiatric: Pt behavior is normal without agitation   Micro: none  Cardiac tracings I have personally interpreted today:  none  Pertinent Radiological findings (summarize): none   Lab Results  Component  Value Date   WBC 7.1 10/11/2020   HGB 14.4 10/11/2020   HCT 43.7 10/11/2020   PLT 318.0 10/11/2020   GLUCOSE 92 10/11/2020   CHOL 187 10/11/2020   TRIG 320.0 (H) 10/11/2020   HDL 36.00 (L) 10/11/2020   LDLDIRECT 110.0 10/11/2020   LDLCALC 143 (H) 09/30/2018   ALT 24 10/11/2020   AST 16 10/11/2020   NA 142 10/11/2020   K 4.9 10/11/2020   CL 104 10/11/2020   CREATININE 0.87 10/11/2020   BUN 14 10/11/2020   CO2 32 10/11/2020   TSH 1.86 10/11/2020   PSA 0.46 10/11/2020   HGBA1C 6.4 10/11/2020   Assessment/Plan:  Roger Long is a 55 y.o. Asian [4] male with  has a past medical history of ALLERGIC RHINITIS (07/19/2009), BACK PAIN (07/19/2009), GERD (08/06/2010), H. pylori infection, Headache(784.0) (08/06/2010), HTN (hypertension) (10/06/2019), HYPERLIPIDEMIA (08/06/2010), Hypertension, Impaired glucose tolerance (09/26/2011), and PLANTAR FASCIITIS (07/19/2009).  HTN (hypertension) BP Readings from Last 3 Encounters:  06/11/21 130/76  03/11/21 110/80  01/21/21 96/60   Stable, pt to continue medical treatment zestril   Hyperlipidemia Uncontrolled as not taking statin, for restart med and f/u lipids next visit  Impaired glucose tolerance Lab Results  Component Value Date   HGBA1C 6.4 10/11/2020   Stable, pt to continue current medical treatment  - diet   URI (upper respiratory infection) Mild to mod, for antibx course,  to f/u any worsening symptoms or concerns  Headache Due to uri- for pain control,  to f/u any worsening symptoms or concerns  Followup: Return in about 4 months (around 10/17/2021).  Cathlean Cower, MD 06/15/2021 12:01 PM [facility]

## 2021-06-15 ENCOUNTER — Encounter: Payer: Self-pay | Admitting: Internal Medicine

## 2021-06-15 NOTE — Assessment & Plan Note (Signed)
BP Readings from Last 3 Encounters:  06/11/21 130/76  03/11/21 110/80  01/21/21 96/60   Stable, pt to continue medical treatment zestril

## 2021-06-15 NOTE — Assessment & Plan Note (Signed)
Mild to mod, for antibx course,  to f/u any worsening symptoms or concerns 

## 2021-06-15 NOTE — Assessment & Plan Note (Signed)
Lab Results  Component Value Date   HGBA1C 6.4 10/11/2020   Stable, pt to continue current medical treatment  - diet

## 2021-06-15 NOTE — Assessment & Plan Note (Signed)
Due to uri- for pain control,  to f/u any worsening symptoms or concerns

## 2021-06-15 NOTE — Assessment & Plan Note (Signed)
Uncontrolled as not taking statin, for restart med and f/u lipids next visit

## 2021-06-23 ENCOUNTER — Other Ambulatory Visit: Payer: Self-pay

## 2021-06-27 MED ORDER — AMITRIPTYLINE HCL 10 MG PO TABS: 20.0000 mg | ORAL_TABLET | Freq: Every day | ORAL | 2 refills | Status: DC

## 2021-09-16 ENCOUNTER — Other Ambulatory Visit: Payer: Self-pay | Admitting: Internal Medicine

## 2021-09-16 NOTE — Telephone Encounter (Signed)
Please refill as per office routine med refill policy (all routine meds to be refilled for 3 mo or monthly (per pt preference) up to one year from last visit, then month to month grace period for 3 mo, then further med refills will have to be denied) ? ?

## 2021-09-21 ENCOUNTER — Other Ambulatory Visit: Payer: Self-pay | Admitting: Gastroenterology

## 2021-10-17 ENCOUNTER — Encounter: Payer: BC Managed Care – PPO | Admitting: Internal Medicine

## 2021-10-24 ENCOUNTER — Ambulatory Visit (INDEPENDENT_AMBULATORY_CARE_PROVIDER_SITE_OTHER): Payer: BC Managed Care – PPO | Admitting: Internal Medicine

## 2021-10-24 ENCOUNTER — Ambulatory Visit (INDEPENDENT_AMBULATORY_CARE_PROVIDER_SITE_OTHER): Payer: BC Managed Care – PPO

## 2021-10-24 ENCOUNTER — Encounter: Payer: Self-pay | Admitting: Internal Medicine

## 2021-10-24 ENCOUNTER — Other Ambulatory Visit: Payer: Self-pay

## 2021-10-24 VITALS — BP 108/60 | HR 100 | Temp 98.7°F | Ht 62.0 in | Wt 146.0 lb

## 2021-10-24 DIAGNOSIS — M25532 Pain in left wrist: Secondary | ICD-10-CM | POA: Diagnosis not present

## 2021-10-24 DIAGNOSIS — K219 Gastro-esophageal reflux disease without esophagitis: Secondary | ICD-10-CM

## 2021-10-24 DIAGNOSIS — Z0000 Encounter for general adult medical examination without abnormal findings: Secondary | ICD-10-CM

## 2021-10-24 DIAGNOSIS — E559 Vitamin D deficiency, unspecified: Secondary | ICD-10-CM | POA: Diagnosis not present

## 2021-10-24 DIAGNOSIS — E538 Deficiency of other specified B group vitamins: Secondary | ICD-10-CM | POA: Diagnosis not present

## 2021-10-24 DIAGNOSIS — E785 Hyperlipidemia, unspecified: Secondary | ICD-10-CM

## 2021-10-24 DIAGNOSIS — E7800 Pure hypercholesterolemia, unspecified: Secondary | ICD-10-CM

## 2021-10-24 DIAGNOSIS — Z0001 Encounter for general adult medical examination with abnormal findings: Secondary | ICD-10-CM | POA: Diagnosis not present

## 2021-10-24 DIAGNOSIS — Z125 Encounter for screening for malignant neoplasm of prostate: Secondary | ICD-10-CM

## 2021-10-24 DIAGNOSIS — I10 Essential (primary) hypertension: Secondary | ICD-10-CM

## 2021-10-24 DIAGNOSIS — R7302 Impaired glucose tolerance (oral): Secondary | ICD-10-CM | POA: Diagnosis not present

## 2021-10-24 LAB — CBC WITH DIFFERENTIAL/PLATELET
Basophils Absolute: 0 10*3/uL (ref 0.0–0.1)
Basophils Relative: 0.2 % (ref 0.0–3.0)
Eosinophils Absolute: 0.5 10*3/uL (ref 0.0–0.7)
Eosinophils Relative: 5.7 % — ABNORMAL HIGH (ref 0.0–5.0)
HCT: 45.7 % (ref 39.0–52.0)
Hemoglobin: 14.9 g/dL (ref 13.0–17.0)
Lymphocytes Relative: 35.8 % (ref 12.0–46.0)
Lymphs Abs: 3.1 10*3/uL (ref 0.7–4.0)
MCHC: 32.6 g/dL (ref 30.0–36.0)
MCV: 90.7 fl (ref 78.0–100.0)
Monocytes Absolute: 0.7 10*3/uL (ref 0.1–1.0)
Monocytes Relative: 8.4 % (ref 3.0–12.0)
Neutro Abs: 4.3 10*3/uL (ref 1.4–7.7)
Neutrophils Relative %: 49.9 % (ref 43.0–77.0)
Platelets: 326 10*3/uL (ref 150.0–400.0)
RBC: 5.05 Mil/uL (ref 4.22–5.81)
RDW: 14.1 % (ref 11.5–15.5)
WBC: 8.5 10*3/uL (ref 4.0–10.5)

## 2021-10-24 LAB — BASIC METABOLIC PANEL
BUN: 14 mg/dL (ref 6–23)
CO2: 31 mEq/L (ref 19–32)
Calcium: 8.9 mg/dL (ref 8.4–10.5)
Chloride: 101 mEq/L (ref 96–112)
Creatinine, Ser: 0.84 mg/dL (ref 0.40–1.50)
GFR: 99.62 mL/min (ref >60.00)
Glucose, Bld: 113 mg/dL — ABNORMAL HIGH (ref 70–99)
Potassium: 4.2 mEq/L (ref 3.5–5.1)
Sodium: 140 mEq/L (ref 135–145)

## 2021-10-24 LAB — VITAMIN D 25 HYDROXY (VIT D DEFICIENCY, FRACTURES): VITD: 16.85 ng/mL — ABNORMAL LOW (ref 30.00–100.00)

## 2021-10-24 LAB — URINALYSIS, ROUTINE W REFLEX MICROSCOPIC
Bilirubin Urine: NEGATIVE
Hgb urine dipstick: NEGATIVE
Ketones, ur: NEGATIVE
Leukocytes,Ua: NEGATIVE
Nitrite: NEGATIVE
RBC / HPF: NONE SEEN (ref 0–?)
Specific Gravity, Urine: 1.02 (ref 1.000–1.030)
Total Protein, Urine: NEGATIVE
Urine Glucose: NEGATIVE
Urobilinogen, UA: 0.2 (ref 0.0–1.0)
WBC, UA: NONE SEEN (ref 0–?)
pH: 7 (ref 5.0–8.0)

## 2021-10-24 LAB — HEMOGLOBIN A1C: Hgb A1c MFr Bld: 6.7 % — ABNORMAL HIGH (ref 4.6–6.5)

## 2021-10-24 LAB — LIPID PANEL
Cholesterol: 194 mg/dL (ref 0–200)
HDL: 39.6 mg/dL (ref >39.00)
NonHDL: 154.37
Total CHOL/HDL Ratio: 5
Triglycerides: 369 mg/dL — ABNORMAL HIGH (ref 0.0–149.0)
VLDL: 73.8 mg/dL — ABNORMAL HIGH (ref 0.0–40.0)

## 2021-10-24 LAB — HEPATIC FUNCTION PANEL
ALT: 22 U/L (ref 0–53)
AST: 16 U/L (ref 0–37)
Albumin: 4.5 g/dL (ref 3.5–5.2)
Alkaline Phosphatase: 66 U/L (ref 39–117)
Bilirubin, Direct: 0.1 mg/dL (ref 0.0–0.3)
Total Bilirubin: 0.3 mg/dL (ref 0.2–1.2)
Total Protein: 7.2 g/dL (ref 6.0–8.3)

## 2021-10-24 LAB — PSA: PSA: 0.44 ng/mL (ref 0.10–4.00)

## 2021-10-24 LAB — LDL CHOLESTEROL, DIRECT: Direct LDL: 112 mg/dL

## 2021-10-24 LAB — VITAMIN B12: Vitamin B-12: 683 pg/mL (ref 211–911)

## 2021-10-24 LAB — TSH: TSH: 2.72 u[IU]/mL (ref 0.35–5.50)

## 2021-10-24 MED ORDER — MELOXICAM 15 MG PO TABS: ORAL_TABLET | ORAL | 3 refills | Status: DC

## 2021-10-24 MED ORDER — CHOLECALCIFEROL 50 MCG (2000 UT) PO TABS: ORAL_TABLET | ORAL | 99 refills | Status: AC

## 2021-10-24 MED ORDER — PANTOPRAZOLE SODIUM 40 MG PO TBEC: 40.0000 mg | DELAYED_RELEASE_TABLET | Freq: Every day | ORAL | 3 refills | Status: DC

## 2021-10-24 NOTE — Progress Notes (Signed)
Patient ID: Roger Long, male   DOB: [birth year], 55 y.o.   MRN: [id number]         Chief Complaint:: wellness exam and low vit d, hld, hyperclyemia, gastriis, left wrist pain       HPI:  Roger Long is a 55 y.o. male here for wellness exam; declines covid vax, shingrix o/w up to date                      Also not taking vit d.  Good compliance with statin and does not want to change, will work on lower chol diet as well.  Pt denies chest pain, increased sob or doe, wheezing, orthopnea, PND, increased LE swelling, palpitations, dizziness or syncope.   Pt denies polydipsia, polyuria, or new focal neuro s/s.  Also has intermittent mild reflux and upper abd discomfort for over 1 mo despite trying to take lower fat diet.  Denies worsening dysphagia, n/v, bowel change or blood.  Did have extensive 2022 GI workup with EGD and colonoscopy, now on pepcid bid   Has gained several lbs in the past 6 mo with extra calories. Also has pain to medial aspect of left wrist for over 3 mo; mild , intermittent, does some heavy lifting at work on occasion.  Worse pain to lift anything over 5-10 lbs with thumb facing upwards, but curiously not worse to oft the same thing with hand pronated and palm lifting facing up.  No falls or other injury.   Wt Readings from Last 3 Encounters:  10/24/21 146 lb (66.2 kg)  06/11/21 138 lb 3.2 oz (62.7 kg)  03/11/21 140 lb 3.2 oz (63.6 kg)   BP Readings from Last 3 Encounters:  10/24/21 108/60  06/11/21 130/76  03/11/21 110/80   Immunization History  Administered Date(s) Administered   Influenza Split 09/13/2012, 09/29/2021   Influenza,inj,Quad PF,6+ Mos 08/29/2015, 08/01/2017, 09/15/2019   Influenza-Unspecified 08/14/2016, 08/14/2018, 09/04/2020   PFIZER(Purple Top)SARS-COV-2 Vaccination 02/26/2020, 03/19/2020, 10/27/2020, 04/04/2021   Td 07/19/2009   Tdap 10/06/2019   There are no preventive care reminders to display for this patient.     Past Medical History:  Diagnosis  Date   ALLERGIC RHINITIS 07/19/2009   BACK PAIN 07/19/2009   GERD 08/06/2010   H. pylori infection    Headache(784.0) 08/06/2010   HTN (hypertension) 10/06/2019   HYPERLIPIDEMIA 08/06/2010   Hypertension    Impaired glucose tolerance 09/26/2011   PLANTAR FASCIITIS 07/19/2009   Past Surgical History:  Procedure Laterality Date   64 HOUR [place] STUDY N/A 03/24/2021   Procedure: 24 HOUR PH STUDY;  Surgeon: Doran Stabler, MD;  Location: [facility];  Service: Gastroenterology;  Laterality: N/A;   ESOPHAGEAL MANOMETRY N/A 03/24/2021   Procedure: ESOPHAGEAL MANOMETRY (EM);  Surgeon: Doran Stabler, MD;  Location: [facility];  Service: Gastroenterology;  Laterality: N/A;   WISDOM TOOTH EXTRACTION      reports that he has quit smoking. He has never used smokeless tobacco. He reports that he does not currently use alcohol. He reports that he does not use drugs. family history includes Hypertension in his mother; Stroke in his mother. No Known Allergies Current Outpatient Medications on File Prior to Visit  Medication Sig Dispense Refill   amitriptyline (ELAVIL) 10 MG tablet Take 2 tablets (20 mg total) by mouth at bedtime. 60 tablet 2   calcium carbonate (TUMS - DOSED IN MG ELEMENTAL CALCIUM) 500 MG chewable tablet Chew 1 tablet by mouth daily.  famotidine (PEPCID) 20 MG tablet Take 1 tablet (20 mg total) by mouth at bedtime. 30 tablet 2   ibuprofen (ADVIL) 800 MG tablet Take 1 tablet (800 mg total) by mouth every 8 (eight) hours as needed. 30 tablet 1   lisinopril (ZESTRIL) 10 MG tablet TAKE 1 TABLET(10 MG) BY MOUTH DAILY 90 tablet 3   pravastatin (PRAVACHOL) 40 MG tablet TAKE 1 TABLET BY MOUTH DAILY 90 tablet 3   No current facility-administered medications on file prior to visit.        ROS:  All others reviewed and negative.  Objective        PE:  BP 108/60 (BP Location: Left Arm, Patient Position: Sitting, Cuff Size: Normal)   Pulse 100   Temp 98.7 F (37.1 C) (Oral)   Ht 5\' 2"   (1.575 m)   Wt 146 lb (66.2 kg)   SpO2 94%   BMI 26.70 kg/m                 Constitutional: Pt appears in NAD               HENT: Head: NCAT.                Right Ear: External ear normal.                 Left Ear: External ear normal.                Eyes: . Pupils are equal, round, and reactive to light. Conjunctivae and EOM are normal               Nose: without d/c or deformity               Neck: Neck supple. Gross normal ROM               Cardiovascular: Normal rate and regular rhythm.                 Pulmonary/Chest: Effort normal and breath sounds without rales or wheezing.                Abd:  Soft, mild epigastric tender, ND, + BS, no organomegaly               Neurological: Pt is alert. At baseline orientation, motor grossly intact               Skin: Skin is warm. No rashes, no other new lesions, LE edema - none               Left wrist with mild medial wrist line tender but no swelling, redness.                 Psychiatric: Pt behavior is normal without agitation   Micro: none  Cardiac tracings I have personally interpreted today:  none  Pertinent Radiological findings (summarize): none   Lab Results  Component Value Date   WBC 8.5 10/24/2021   HGB 14.9 10/24/2021   HCT 45.7 10/24/2021   PLT 326.0 10/24/2021   GLUCOSE 113 (H) 10/24/2021   CHOL 194 10/24/2021   TRIG 369.0 (H) 10/24/2021   HDL 39.60 10/24/2021   LDLDIRECT 112.0 10/24/2021   LDLCALC 143 (H) 09/30/2018   ALT 22 10/24/2021   AST 16 10/24/2021   NA 140 10/24/2021   K 4.2 10/24/2021   CL 101 10/24/2021   CREATININE 0.84 10/24/2021   BUN 14 10/24/2021   CO2 31  10/24/2021   TSH 2.72 10/24/2021   PSA 0.44 10/24/2021   HGBA1C 6.7 (H) 10/24/2021   Assessment/Plan:  Roger Long is a 55 y.o. Asian [4] male Asian [4] male with  has a past medical history of ALLERGIC RHINITIS (07/19/2009), BACK PAIN (07/19/2009), GERD (08/06/2010), H. pylori infection, Headache(784.0) (08/06/2010), HTN (hypertension) (10/06/2019),  HYPERLIPIDEMIA (08/06/2010), Hypertension, Impaired glucose tolerance (09/26/2011), and PLANTAR FASCIITIS (07/19/2009).  Vitamin D deficiency Last vitamin D Lab Results  Component Value Date   VD25OH 21.07 (L) 10/11/2020   Low, to start oral replacement   Encounter for well adult exam with abnormal findings Age and sex appropriate education and counseling updated with regular exercise and diet Referrals for preventative services - none needed Immunizations addressed - declines covid booster, shingrix Smoking counseling  - none needed Evidence for depression or other mood disorder - none significant Most recent labs reviewed. I have personally reviewed and have noted: 1) the patient's medical and social history 2) The patient's current medications and supplements 3) The patient's height, weight, and BMI have been recorded in the chart   Hyperlipidemia Lab Results  Component Value Date   LDLCALC 143 (H) 09/30/2018   Uncontrolled, goal ldl < 100, pt to retsart statin pravachol 40 qd   Impaired glucose tolerance Lab Results  Component Value Date   HGBA1C 6.7 (H) 10/24/2021   Mild uncontrolled, pt to continue current medical treatment  - diet and wt control   HTN (hypertension) BP Readings from Last 3 Encounters:  10/24/21 108/60  06/11/21 130/76  03/11/21 110/80   Stable, pt to continue medical treatment lisinopril   GERD With mild breakthrough symptoms and possible gastritis - for add protonix 40 qd consdier GI f/u  Left wrist pain Medial aspect mild intermittent, brought out with lifting in a certain way at work, for [employer] prn, and refer sports medicine  Followup: No follow-ups on file.  Cathlean Cower, MD 10/25/2021 12:47 PM [facility]

## 2021-10-24 NOTE — Patient Instructions (Addendum)
Please take all new medication as prescribed - the protonix for stomach acid, and mobic as needed for wrist pain  Please take OTC Vitamin D3 at 2000 units per day, indefinitely  Please continue all other medications as before, and refills have been done if requested.  Please have the pharmacy call with any other refills you may need.  Please continue your efforts at being more active, low cholesterol diet, and weight control.  You are otherwise up to date with prevention measures today.  Please keep your appointments with your specialists as you may have planned  You will be contacted regarding the referral for: Sports Medicine on the first floor for the left wrist  Please go to the XRAY Department in the first floor for the x-ray testing - for the left wrist  Please go to the LAB at the blood drawing area for the tests to be done  You will be contacted by phone if any changes need to be made immediately.  Otherwise, you will receive a letter about your results with an explanation, but please check with MyChart first.  Please remember to sign up for MyChart if you have not done so, as this will be important to you in the future with finding out test results, communicating by private email, and scheduling acute appointments online when needed.  Please make an Appointment to return for your 1 year visit, or sooner if needed

## 2021-10-24 NOTE — Assessment & Plan Note (Signed)
Last vitamin D Lab Results  Component Value Date   VD25OH 21.07 (L) 10/11/2020   Low, to start oral replacement

## 2021-10-25 DIAGNOSIS — M25532 Pain in left wrist: Secondary | ICD-10-CM | POA: Insufficient documentation

## 2021-10-25 NOTE — Assessment & Plan Note (Signed)

## 2021-10-25 NOTE — Assessment & Plan Note (Signed)
Lab Results  Component Value Date   HGBA1C 6.7 (H) 10/24/2021   Mild uncontrolled, pt to continue current medical treatment  - diet and wt control

## 2021-10-25 NOTE — Assessment & Plan Note (Signed)
With mild breakthrough symptoms and possible gastritis - for add protonix 40 qd consdier GI f/u

## 2021-10-25 NOTE — Assessment & Plan Note (Signed)
BP Readings from Last 3 Encounters:  10/24/21 108/60  06/11/21 130/76  03/11/21 110/80   Stable, pt to continue medical treatment lisinopril

## 2021-10-25 NOTE — Assessment & Plan Note (Signed)
Medial aspect mild intermittent, brought out with lifting in a certain way at work, for [employer] prn, and refer sports medicine

## 2021-10-25 NOTE — Assessment & Plan Note (Signed)
Lab Results  Component Value Date   LDLCALC 143 (H) 09/30/2018   Uncontrolled, goal ldl < 100, pt to retsart statin pravachol 40 qd

## 2021-10-28 ENCOUNTER — Encounter: Payer: Self-pay | Admitting: Internal Medicine

## 2021-11-10 ENCOUNTER — Other Ambulatory Visit: Payer: Self-pay

## 2021-11-10 MED ORDER — AMITRIPTYLINE HCL 10 MG PO TABS
20.0000 mg | ORAL_TABLET | Freq: Every day | ORAL | 2 refills | Status: DC
Start: 2021-11-10 — End: 2022-03-10

## 2022-02-28 ENCOUNTER — Other Ambulatory Visit: Payer: Self-pay | Admitting: Gastroenterology

## 2022-03-06 ENCOUNTER — Telehealth: Payer: Self-pay | Admitting: Gastroenterology

## 2022-03-06 NOTE — Telephone Encounter (Signed)
Dr Loletha Carrow- ?Patient calls requesting refills of amitriptyline. I advised that it appears he was told to discontinue this medication at his office visit 02/2021 (pt told us the med was ineffective). However, he states when he stopped the medication, his symptoms all came back. He began taking 2 tablets of amitriptyline 10 mg and his symptoms improved. It does look like he was sent #60 tabs on 11/10/21 with 2 additional refills. Are you okay with continuing to refill? ? ?

## 2022-03-06 NOTE — Telephone Encounter (Signed)
Inbound call from patients daughter stating patient was not able to get Amitriptyline refilled. Please advise.  ?

## 2022-03-09 NOTE — Telephone Encounter (Signed)
My misunderstanding - thank you for clarifying with him. ? ?Yes, it can be refilled for a 90 day supply at this dose with zero refills. ? ?Clinic visit to see me within next 2 months. ? ?- HD ?

## 2022-03-10 MED ORDER — AMITRIPTYLINE HCL 10 MG PO TABS: 20.0000 mg | ORAL_TABLET | Freq: Every day | ORAL | 0 refills | Status: DC

## 2022-03-10 NOTE — Telephone Encounter (Signed)
I have spoken to patient to advise that rx has been sent for amitriptyline. He has also scheduled a follow up with Dr Loletha Carrow on 05/08/22 at 49 am.  ?

## 2022-05-08 ENCOUNTER — Ambulatory Visit (INDEPENDENT_AMBULATORY_CARE_PROVIDER_SITE_OTHER): Payer: BC Managed Care – PPO | Admitting: Gastroenterology

## 2022-05-08 ENCOUNTER — Encounter: Payer: Self-pay | Admitting: Gastroenterology

## 2022-05-08 VITALS — BP 100/70 | HR 116 | Ht 62.0 in | Wt 143.0 lb

## 2022-05-08 DIAGNOSIS — K31A0 Gastric intestinal metaplasia, unspecified: Secondary | ICD-10-CM

## 2022-05-08 DIAGNOSIS — R140 Abdominal distension (gaseous): Secondary | ICD-10-CM | POA: Diagnosis not present

## 2022-05-08 DIAGNOSIS — R12 Heartburn: Secondary | ICD-10-CM | POA: Diagnosis not present

## 2022-05-08 DIAGNOSIS — K30 Functional dyspepsia: Secondary | ICD-10-CM

## 2022-05-08 NOTE — Progress Notes (Signed)
[facility] Progress Note  Chief Complaint: Heartburn and dyspepsia  Subjective  History: Roger Long was last seen in the office March 2022 for functional heartburn dyspepsia and bloating.  Extensive prior testing did not show significant reflux, and I started him on amitriptyline 10 mg nightly.  He had initial improvement with that, then at the last visit with me he reported it no longer seem to be helping so I recommended he discontinue it.  He apparently then continue to refill it and take it at some point afterward and contacted [place] recently requesting a refill because without the medicine his symptoms return. Also has gastric IM (focal) in 2019- surveillance Bx taken Nov 2021 (see below)-  -3 year recall recommended.  He is generally feeling well as long as he takes Pepcid twice daily and amitriptyline 10 mg at bedtime.  If he misses either for 2 or 3 days he will have the return of upper abdominal bloating, but belching and heartburn.  ROS: Cardiovascular:  no chest pain Respiratory: no dyspnea  The patient's Past Medical, Family and Social History were reviewed and are on file in the EMR.  Objective:  Med list reviewed  Current Outpatient Medications:    amitriptyline (ELAVIL) 10 MG tablet, Take 2 tablets (20 mg total) by mouth at bedtime. (Patient taking differently: Take 10 mg by mouth at bedtime.), Disp: 180 tablet, Rfl: 0   Cholecalciferol 50 MCG (2000 UT) TABS, 1 tab by mouth once daily, Disp: 30 tablet, Rfl: 99   famotidine (PEPCID) 20 MG tablet, Take 1 tablet (20 mg total) by mouth at bedtime. (Patient taking differently: Take 20 mg by mouth 2 (two) times daily.), Disp: 30 tablet, Rfl: 2   lisinopril (ZESTRIL) 10 MG tablet, TAKE 1 TABLET(10 MG) BY MOUTH DAILY, Disp: 90 tablet, Rfl: 3   pravastatin (PRAVACHOL) 40 MG tablet, TAKE 1 TABLET BY MOUTH DAILY, Disp: 90 tablet, Rfl: 3   Vital signs in last 24 hrs: Vitals:   05/08/22 1039  BP: 100/70  Pulse: (!) 116   Wt  Readings from Last 3 Encounters:  05/08/22 143 lb (64.9 kg)  10/24/21 146 lb (66.2 kg)  06/11/21 138 lb 3.2 oz (62.7 kg)    Physical Exam  Well-appearing, normal vocal quality HEENT: sclera anicteric, oral mucosa moist without lesions Neck: supple, no thyromegaly, JVD or lymphadenopathy Cardiac: Regular, no murmur,  no peripheral edema Pulm: clear to auscultation bilaterally, normal RR and effort noted Abdomen: soft, no tenderness, with active bowel sounds. No guarding or palpable hepatosplenomegaly.   Labs:   ___________________________________________ Radiologic studies:  1. Surgical [P], gastric antrum, hx gastric intestinal metaplasia - ANTRAL MUCOSA WITH MILD CHRONIC INFLAMMATION. [place] NEGATIVE FOR HELICOBACTER PYLORI. - NO INTESTINAL METAPLASIA, DYSPLASIA OR CARCINOMA. 2. Surgical [P], stomach, gastric incisura, hx gastric intestinal metaplasia - OXYNTIC MUCOSA WITH MILD CHRONIC INFLAMMATION. [place] NEGATIVE FOR HELICOBACTER PYLORI. - NO INTESTINAL METAPLASIA, DYSPLASIA OR CARCINOMA. 3. Surgical [P], stomach, lesser curve gastric body, hx gastric intestinal metaplasia - OXYNTIC MUCOSA WITH MILD CHRONIC INFLAMMATION. [place] NEGATIVE FOR HELICOBACTER PYLORI. - NO INTESTINAL METAPLASIA, DYSPLASIA OR CARCINOMA. 4. Surgical [P], stomach, gastric body greater curve, hx gastric instestinal metaplasia - OXYNTIC MUCOSA WITH MILD CHRONIC INFLAMMATION. [place] NEGATIVE FOR HELICOBACTER PYLORI. - NO INTESTINAL METAPLASIA, DYSPLASIA OR CARCINOMA. ____________________________________________ Other:   _____________________________________________ Assessment & Plan  Assessment: Encounter Diagnoses  Name Primary?   Heartburn Yes   Abdominal bloating    Gastric intestinal metaplasia  Non-ulcer dyspepsia     Functional symptoms improved on his current regimen.  Low risk long-term treatment plan that seems to work well for him, so I  will refill meds as needed. He had incidental focal gastric intestinal metaplasia on some biopsies in 2019 but none seen on more extensive surveillance biopsies in 2021 He will be recalled for surveillance EGD for GIM biopsies and late 2024.  If none is seen on that, no further recall would be recommended.    Nelida Meuse III

## 2022-05-08 NOTE — Patient Instructions (Signed)
If you are age 54 or older, your body mass index should be between 23-30. Your Body mass index is 26.16 kg/m. If this is out of the aforementioned range listed, please consider follow up with your Primary Care Provider.  If you are age 53 or younger, your body mass index should be between 19-25. Your Body mass index is 26.16 kg/m. If this is out of the aformentioned range listed, please consider follow up with your Primary Care Provider.   ________________________________________________________  The [facility] providers would like to encourage you to use [facility] to communicate with providers for non-urgent requests or questions.  Due to long hold times on the telephone, sending your provider a message by [facility] may be a faster and more efficient way to get a response.  Please allow 48 business hours for a response.  Please remember that this is for non-urgent requests.  _______________________________________________________  It was a pleasure to see you today!  Thank you for trusting me with your gastrointestinal care!

## 2022-06-02 ENCOUNTER — Other Ambulatory Visit: Payer: Self-pay | Admitting: Gastroenterology

## 2022-09-08 ENCOUNTER — Other Ambulatory Visit: Payer: Self-pay

## 2022-09-08 MED ORDER — AMITRIPTYLINE HCL 10 MG PO TABS: ORAL_TABLET | ORAL | 0 refills | Status: DC

## 2022-09-17 ENCOUNTER — Other Ambulatory Visit: Payer: Self-pay

## 2022-09-17 MED ORDER — LISINOPRIL 10 MG PO TABS: ORAL_TABLET | ORAL | 3 refills | Status: DC

## 2022-09-21 ENCOUNTER — Other Ambulatory Visit: Payer: Self-pay

## 2022-10-30 ENCOUNTER — Encounter: Payer: BC Managed Care – PPO | Admitting: Internal Medicine

## 2022-10-30 ENCOUNTER — Ambulatory Visit (INDEPENDENT_AMBULATORY_CARE_PROVIDER_SITE_OTHER): Payer: BC Managed Care – PPO | Admitting: Internal Medicine

## 2022-10-30 ENCOUNTER — Other Ambulatory Visit: Payer: Self-pay | Admitting: Internal Medicine

## 2022-10-30 ENCOUNTER — Encounter: Payer: Self-pay | Admitting: Internal Medicine

## 2022-10-30 VITALS — BP 108/64 | HR 101 | Temp 98.4°F | Ht 62.0 in | Wt 143.0 lb

## 2022-10-30 DIAGNOSIS — R1310 Dysphagia, unspecified: Secondary | ICD-10-CM | POA: Diagnosis not present

## 2022-10-30 DIAGNOSIS — E559 Vitamin D deficiency, unspecified: Secondary | ICD-10-CM | POA: Diagnosis not present

## 2022-10-30 DIAGNOSIS — I10 Essential (primary) hypertension: Secondary | ICD-10-CM

## 2022-10-30 DIAGNOSIS — Z0001 Encounter for general adult medical examination with abnormal findings: Secondary | ICD-10-CM

## 2022-10-30 DIAGNOSIS — E538 Deficiency of other specified B group vitamins: Secondary | ICD-10-CM

## 2022-10-30 DIAGNOSIS — Z125 Encounter for screening for malignant neoplasm of prostate: Secondary | ICD-10-CM

## 2022-10-30 DIAGNOSIS — R1013 Epigastric pain: Secondary | ICD-10-CM | POA: Insufficient documentation

## 2022-10-30 DIAGNOSIS — R42 Dizziness and giddiness: Secondary | ICD-10-CM

## 2022-10-30 DIAGNOSIS — R7302 Impaired glucose tolerance (oral): Secondary | ICD-10-CM

## 2022-10-30 LAB — BASIC METABOLIC PANEL
BUN: 14 mg/dL (ref 6–23)
CO2: 33 mEq/L — ABNORMAL HIGH (ref 19–32)
Calcium: 8.7 mg/dL (ref 8.4–10.5)
Chloride: 102 mEq/L (ref 96–112)
Creatinine, Ser: 0.76 mg/dL (ref 0.40–1.50)
GFR: 101.95 mL/min (ref >60.00)
Glucose, Bld: 113 mg/dL — ABNORMAL HIGH (ref 70–99)
Potassium: 3.9 mEq/L (ref 3.5–5.1)
Sodium: 139 mEq/L (ref 135–145)

## 2022-10-30 LAB — URINALYSIS, ROUTINE W REFLEX MICROSCOPIC
Bilirubin Urine: NEGATIVE
Hgb urine dipstick: NEGATIVE
Ketones, ur: NEGATIVE
Leukocytes,Ua: NEGATIVE
Nitrite: NEGATIVE
Specific Gravity, Urine: 1.01 (ref 1.000–1.030)
Total Protein, Urine: NEGATIVE
Urine Glucose: NEGATIVE
Urobilinogen, UA: 0.2 (ref 0.0–1.0)
WBC, UA: NONE SEEN (ref 0–?)
pH: 6 (ref 5.0–8.0)

## 2022-10-30 LAB — HEPATIC FUNCTION PANEL
ALT: 22 U/L (ref 0–53)
AST: 19 U/L (ref 0–37)
Albumin: 4.6 g/dL (ref 3.5–5.2)
Alkaline Phosphatase: 60 U/L (ref 39–117)
Bilirubin, Direct: 0.1 mg/dL (ref 0.0–0.3)
Total Bilirubin: 0.3 mg/dL (ref 0.2–1.2)
Total Protein: 7.7 g/dL (ref 6.0–8.3)

## 2022-10-30 LAB — CBC WITH DIFFERENTIAL/PLATELET
Basophils Absolute: 0 10*3/uL (ref 0.0–0.1)
Basophils Relative: 0.3 % (ref 0.0–3.0)
Eosinophils Absolute: 0.3 10*3/uL (ref 0.0–0.7)
Eosinophils Relative: 4.2 % (ref 0.0–5.0)
HCT: 49.1 % (ref 39.0–52.0)
Hemoglobin: 16 g/dL (ref 13.0–17.0)
Lymphocytes Relative: 35.2 % (ref 12.0–46.0)
Lymphs Abs: 2.9 10*3/uL (ref 0.7–4.0)
MCHC: 32.6 g/dL (ref 30.0–36.0)
MCV: 90.6 fl (ref 78.0–100.0)
Monocytes Absolute: 0.7 10*3/uL (ref 0.1–1.0)
Monocytes Relative: 7.9 % (ref 3.0–12.0)
Neutro Abs: 4.3 10*3/uL (ref 1.4–7.7)
Neutrophils Relative %: 52.4 % (ref 43.0–77.0)
Platelets: 359 10*3/uL (ref 150.0–400.0)
RBC: 5.42 Mil/uL (ref 4.22–5.81)
RDW: 13.6 % (ref 11.5–15.5)
WBC: 8.2 10*3/uL (ref 4.0–10.5)

## 2022-10-30 LAB — LIPID PANEL
Cholesterol: 236 mg/dL — ABNORMAL HIGH (ref 0–200)
HDL: 39.1 mg/dL (ref >39.00)
NonHDL: 196.87
Total CHOL/HDL Ratio: 6
Triglycerides: 285 mg/dL — ABNORMAL HIGH (ref 0.0–149.0)
VLDL: 57 mg/dL — ABNORMAL HIGH (ref 0.0–40.0)

## 2022-10-30 LAB — PSA: PSA: 0.65 ng/mL (ref 0.10–4.00)

## 2022-10-30 LAB — MICROALBUMIN / CREATININE URINE RATIO
Creatinine,U: 71.2 mg/dL
Microalb Creat Ratio: 1 mg/g (ref 0.0–30.0)
Microalb, Ur: <0.7 mg/dL (ref 0.0–1.9)

## 2022-10-30 LAB — TSH: TSH: 1.49 u[IU]/mL (ref 0.35–5.50)

## 2022-10-30 LAB — HEMOGLOBIN A1C: Hgb A1c MFr Bld: 6.5 % (ref 4.6–6.5)

## 2022-10-30 LAB — VITAMIN B12: Vitamin B-12: 594 pg/mL (ref 211–911)

## 2022-10-30 LAB — VITAMIN D 25 HYDROXY (VIT D DEFICIENCY, FRACTURES): VITD: 27.04 ng/mL — ABNORMAL LOW (ref 30.00–100.00)

## 2022-10-30 LAB — LDL CHOLESTEROL, DIRECT: Direct LDL: 162 mg/dL

## 2022-10-30 MED ORDER — ROSUVASTATIN CALCIUM 40 MG PO TABS: 40.0000 mg | ORAL_TABLET | Freq: Every day | ORAL | 3 refills | Status: DC

## 2022-10-30 MED ORDER — MECLIZINE HCL 12.5 MG PO TABS: 12.5000 mg | ORAL_TABLET | Freq: Three times a day (TID) | ORAL | 2 refills | Status: AC | PRN

## 2022-10-30 NOTE — Assessment & Plan Note (Signed)
Lab Results  Component Value Date   HGBA1C 6.7 (H) 10/24/2021   Mild uncontrolled, pt to continue current medical treatment - diet, wt control, exercise, and f/u lab today

## 2022-10-30 NOTE — Assessment & Plan Note (Signed)
BP Readings from Last 3 Encounters:  10/30/22 108/64  05/08/22 100/70  10/24/21 108/60   Stable, pt to continue medical treatment lisinopril 10 mg qd

## 2022-10-30 NOTE — Assessment & Plan Note (Signed)
Last vitamin D Lab Results  Component Value Date   VD25OH 16.85 (L) 10/24/2021   Low, reminded to start oral replacement

## 2022-10-30 NOTE — Patient Instructions (Addendum)
Please have your Shingrix (shingles) shots done at your local pharmacy.  Please take OTC Vitamin D3 at 2000 units per day, indefinitely  Please take all new medication as prescribed - the meclizine as needed for the dizziness  Please continue all other medications as before, and refills have been done if requested.  Please have the pharmacy call with any other refills you may need.  Please continue your efforts at being more active, low cholesterol diet, and weight control.  You are otherwise up to date with prevention measures today.  Please keep your appointments with your specialists as you may have planned  You will be contacted regarding the referral for: Gastroenterology for the swallowing problem  Please go to the LAB at the blood drawing area for the tests to be done  You will be contacted by phone if any changes need to be made immediately.  Otherwise, you will receive a letter about your results with an explanation, but please check with MyChart first.  Please remember to sign up for MyChart if you have not done so, as this will be important to you in the future with finding out test results, communicating by private email, and scheduling acute appointments online when needed.  Please make an Appointment to return for your 1 year visit, or sooner if needed

## 2022-10-30 NOTE — Assessment & Plan Note (Signed)
Etiology unclear, for GI referral- may need EGD

## 2022-10-30 NOTE — Assessment & Plan Note (Signed)
Age and sex appropriate education and counseling updated with regular exercise and diet Referrals for preventative services - none needed Immunizations addressed - declines covid booster, for shingrix at the pharmacy Smoking counseling  - none needed Evidence for depression or other mood disorder - none significant Most recent labs reviewed. I have personally reviewed and have noted: 1) the patient's medical and social history 2) The patient's current medications and supplements 3) The patient's height, weight, and BMI have been recorded in the chart

## 2022-10-30 NOTE — Assessment & Plan Note (Signed)
Very mild intermittent, ok for meclizine prn persistent symtpoms

## 2022-10-30 NOTE — Progress Notes (Signed)
Patient ID: Roger Long, male   DOB: 07/15/1966, 56 y.o.   MRN: [id number]         Chief Complaint:: wellness exam and Physical (Difficulty swallowing when drinking)  , low vit d, vertigo, htn, hyperglycemia       HPI:  Roger Long is a 56 y.o. male here for wellness exam; declines covid booster, plans to have shingrix at pharmacy, o/w up to date                        Also c/o 2 month mild positional vertigo with nasal congestion, only happened a few times for < 30 sec.  Not taking Vit D.  Also has c/o 1 mo worsening dysphagia to solids, better with liquid chaser, seems to get stuck in the throat, and Denies worsening reflux, abd pain, dysphagia, n/v, bowel change or blood. Pt denies chest pain, increased sob or doe, wheezing, orthopnea, PND, increased LE swelling, palpitations, dizziness or syncope.   Pt denies polydipsia, polyuria, or new focal neuro s/s.    Pt denies fever, wt loss, night sweats, loss of appetite, or other constitutional symptoms   Wt Readings from Last 3 Encounters:  10/30/22 143 lb (64.9 kg)  05/08/22 143 lb (64.9 kg)  10/24/21 146 lb (66.2 kg)   BP Readings from Last 3 Encounters:  10/30/22 108/64  05/08/22 100/70  10/24/21 108/60   Immunization History  Administered Date(s) Administered   Influenza Split 09/13/2012, 09/29/2021   Influenza,inj,Quad PF,6+ Mos 08/29/2015, 08/01/2017, 09/15/2019   Influenza-Unspecified 08/14/2016, 08/14/2018, 09/04/2020, 08/30/2022   PFIZER(Purple Top)SARS-COV-2 Vaccination 02/26/2020, 03/19/2020, 10/27/2020, 04/04/2021   Td 07/19/2009   Tdap 10/06/2019  There are no preventive care reminders to display for this patient.    Past Medical History:  Diagnosis Date   ALLERGIC RHINITIS 07/19/2009   BACK PAIN 07/19/2009   GERD 08/06/2010   H. pylori infection    Headache(784.0) 08/06/2010   HTN (hypertension) 10/06/2019   HYPERLIPIDEMIA 08/06/2010   Hypertension    Impaired glucose tolerance 09/26/2011   PLANTAR FASCIITIS 07/19/2009    Past Surgical History:  Procedure Laterality Date   46 HOUR [place] STUDY N/A 03/24/2021   Procedure: 24 HOUR PH STUDY;  Surgeon: Doran Stabler, MD;  Location: [facility];  Service: Gastroenterology;  Laterality: N/A;   ESOPHAGEAL MANOMETRY N/A 03/24/2021   Procedure: ESOPHAGEAL MANOMETRY (EM);  Surgeon: Doran Stabler, MD;  Location: [facility];  Service: Gastroenterology;  Laterality: N/A;   WISDOM TOOTH EXTRACTION      reports that he has quit smoking. He has never used smokeless tobacco. He reports that he does not currently use alcohol. He reports that he does not use drugs. family history includes Hypertension in his mother; Stroke in his mother. No Known Allergies Current Outpatient Medications on File Prior to Visit  Medication Sig Dispense Refill   amitriptyline (ELAVIL) 10 MG tablet TAKE 2 TABLETS(20 MG) BY MOUTH AT BEDTIME 180 tablet 0   Cholecalciferol 50 MCG (2000 UT) TABS 1 tab by mouth once daily 30 tablet 99   famotidine (PEPCID) 20 MG tablet Take 1 tablet (20 mg total) by mouth at bedtime. (Patient taking differently: Take 20 mg by mouth 2 (two) times daily.) 30 tablet 2   lisinopril (ZESTRIL) 10 MG tablet TAKE 1 TABLET(10 MG) BY MOUTH DAILY 90 tablet 3   pravastatin (PRAVACHOL) 40 MG tablet TAKE 1 TABLET BY MOUTH DAILY 90 tablet 3   No current facility-administered medications  on file prior to visit.        ROS:  All others reviewed and negative.  Objective        PE:  BP 108/64 (BP Location: Right Arm, Patient Position: Sitting, Cuff Size: Large)   Pulse (!) 101   Temp 98.4 F (36.9 C) (Oral)   Ht '5\' 2"'$  (1.575 m)   Wt 143 lb (64.9 kg)   SpO2 94%   BMI 26.16 kg/m                 Constitutional: Pt appears in NAD               HENT: Head: NCAT.                Right Ear: External ear normal.                 Left Ear: External ear normal.                Eyes: . Pupils are equal, round, and reactive to light. Conjunctivae and EOM are normal                Nose: without d/c or deformity               Neck: Neck supple. Gross normal ROM               Cardiovascular: Normal rate and regular rhythm.                 Pulmonary/Chest: Effort normal and breath sounds without rales or wheezing.                Abd:  Soft, NT, ND, + BS, no organomegaly               Neurological: Pt is alert. At baseline orientation, motor grossly intact               Skin: Skin is warm. No rashes, no other new lesions, LE edema - none               Psychiatric: Pt behavior is normal without agitation   Micro: none  Cardiac tracings I have personally interpreted today:  none  Pertinent Radiological findings (summarize): none   Lab Results  Component Value Date   WBC 8.5 10/24/2021   HGB 14.9 10/24/2021   HCT 45.7 10/24/2021   PLT 326.0 10/24/2021   GLUCOSE 113 (H) 10/24/2021   CHOL 194 10/24/2021   TRIG 369.0 (H) 10/24/2021   HDL 39.60 10/24/2021   LDLDIRECT 112.0 10/24/2021   LDLCALC 143 (H) 09/30/2018   ALT 22 10/24/2021   AST 16 10/24/2021   NA 140 10/24/2021   K 4.2 10/24/2021   CL 101 10/24/2021   CREATININE 0.84 10/24/2021   BUN 14 10/24/2021   CO2 31 10/24/2021   TSH 2.72 10/24/2021   PSA 0.44 10/24/2021   HGBA1C 6.7 (H) 10/24/2021   Assessment/Plan:  Roger Long is a 56 y.o. Asian [4] male with  has a past medical history of ALLERGIC RHINITIS (07/19/2009), BACK PAIN (07/19/2009), GERD (08/06/2010), H. pylori infection, Headache(784.0) (08/06/2010), HTN (hypertension) (10/06/2019), HYPERLIPIDEMIA (08/06/2010), Hypertension, Impaired glucose tolerance (09/26/2011), and PLANTAR FASCIITIS (07/19/2009).  Vitamin D deficiency Last vitamin D Lab Results  Component Value Date   VD25OH 16.85 (L) 10/24/2021   Low, reminded to start oral replacement   Encounter for well adult exam with abnormal findings Age and sex appropriate education and counseling updated  with regular exercise and diet Referrals for preventative services - none needed Immunizations  addressed - declines covid booster, for shingrix at the pharmacy Smoking counseling  - none needed Evidence for depression or other mood disorder - none significant Most recent labs reviewed. I have personally reviewed and have noted: 1) the patient's medical and social history 2) The patient's current medications and supplements 3) The patient's height, weight, and BMI have been recorded in the chart   Impaired glucose tolerance Lab Results  Component Value Date   HGBA1C 6.7 (H) 10/24/2021   Mild uncontrolled, pt to continue current medical treatment - diet, wt control, exercise, and f/u lab today   HTN (hypertension) BP Readings from Last 3 Encounters:  10/30/22 108/64  05/08/22 100/70  10/24/21 108/60   Stable, pt to continue medical treatment lisinopril 10 mg qd   Dysphagia Etiology unclear, for GI referral- may need EGD  Vertigo Very mild intermittent, ok for meclizine prn persistent symtpoms  Followup: Return in about 1 year (around 10/31/2023).  Cathlean Cower, MD 10/30/2022 3:36 PM [facility]

## 2023-04-16 ENCOUNTER — Encounter: Payer: Self-pay | Admitting: Gastroenterology

## 2023-10-11 ENCOUNTER — Encounter: Payer: Self-pay | Admitting: Internal Medicine

## 2023-10-12 ENCOUNTER — Other Ambulatory Visit: Payer: Self-pay

## 2023-10-12 MED ORDER — LISINOPRIL 10 MG PO TABS: ORAL_TABLET | ORAL | 3 refills | Status: DC

## 2023-11-05 ENCOUNTER — Ambulatory Visit (INDEPENDENT_AMBULATORY_CARE_PROVIDER_SITE_OTHER): Payer: BC Managed Care – PPO | Admitting: Internal Medicine

## 2023-11-05 ENCOUNTER — Ambulatory Visit (INDEPENDENT_AMBULATORY_CARE_PROVIDER_SITE_OTHER): Payer: BC Managed Care – PPO

## 2023-11-05 ENCOUNTER — Encounter: Payer: Self-pay | Admitting: Internal Medicine

## 2023-11-05 VITALS — BP 132/80 | HR 80 | Temp 98.0°F | Ht 62.0 in | Wt 147.0 lb

## 2023-11-05 DIAGNOSIS — Z0001 Encounter for general adult medical examination with abnormal findings: Secondary | ICD-10-CM

## 2023-11-05 DIAGNOSIS — E7800 Pure hypercholesterolemia, unspecified: Secondary | ICD-10-CM | POA: Diagnosis not present

## 2023-11-05 DIAGNOSIS — I10 Essential (primary) hypertension: Secondary | ICD-10-CM | POA: Diagnosis not present

## 2023-11-05 DIAGNOSIS — E559 Vitamin D deficiency, unspecified: Secondary | ICD-10-CM | POA: Diagnosis not present

## 2023-11-05 DIAGNOSIS — R0609 Other forms of dyspnea: Secondary | ICD-10-CM

## 2023-11-05 DIAGNOSIS — Z0000 Encounter for general adult medical examination without abnormal findings: Secondary | ICD-10-CM | POA: Diagnosis not present

## 2023-11-05 DIAGNOSIS — Z125 Encounter for screening for malignant neoplasm of prostate: Secondary | ICD-10-CM

## 2023-11-05 DIAGNOSIS — R7302 Impaired glucose tolerance (oral): Secondary | ICD-10-CM | POA: Diagnosis not present

## 2023-11-05 DIAGNOSIS — I771 Stricture of artery: Secondary | ICD-10-CM | POA: Diagnosis not present

## 2023-11-05 DIAGNOSIS — Z1211 Encounter for screening for malignant neoplasm of colon: Secondary | ICD-10-CM

## 2023-11-05 LAB — URINALYSIS, ROUTINE W REFLEX MICROSCOPIC
Bilirubin Urine: NEGATIVE
Hgb urine dipstick: NEGATIVE
Ketones, ur: NEGATIVE
Leukocytes,Ua: NEGATIVE
Nitrite: NEGATIVE
Specific Gravity, Urine: 1.02 (ref 1.000–1.030)
Total Protein, Urine: NEGATIVE
Urine Glucose: NEGATIVE
Urobilinogen, UA: 0.2 (ref 0.0–1.0)
pH: 7 (ref 5.0–8.0)

## 2023-11-05 LAB — CBC WITH DIFFERENTIAL/PLATELET
Basophils Absolute: 0 10*3/uL (ref 0.0–0.1)
Basophils Relative: 0.3 % (ref 0.0–3.0)
Eosinophils Absolute: 0.3 10*3/uL (ref 0.0–0.7)
Eosinophils Relative: 3.2 % (ref 0.0–5.0)
HCT: 46.9 % (ref 39.0–52.0)
Hemoglobin: 15.1 g/dL (ref 13.0–17.0)
Lymphocytes Relative: 30.2 % (ref 12.0–46.0)
Lymphs Abs: 2.6 10*3/uL (ref 0.7–4.0)
MCHC: 32.2 g/dL (ref 30.0–36.0)
MCV: 90.8 fL (ref 78.0–100.0)
Monocytes Absolute: 0.8 10*3/uL (ref 0.1–1.0)
Monocytes Relative: 9 % (ref 3.0–12.0)
Neutro Abs: 5 10*3/uL (ref 1.4–7.7)
Neutrophils Relative %: 57.3 % (ref 43.0–77.0)
Platelets: 318 10*3/uL (ref 150.0–400.0)
RBC: 5.16 Mil/uL (ref 4.22–5.81)
RDW: 13.9 % (ref 11.5–15.5)
WBC: 8.7 10*3/uL (ref 4.0–10.5)

## 2023-11-05 LAB — HEPATIC FUNCTION PANEL
ALT: 29 U/L (ref 0–53)
AST: 20 U/L (ref 0–37)
Albumin: 4.4 g/dL (ref 3.5–5.2)
Alkaline Phosphatase: 68 U/L (ref 39–117)
Bilirubin, Direct: 0.1 mg/dL (ref 0.0–0.3)
Total Bilirubin: 0.3 mg/dL (ref 0.2–1.2)
Total Protein: 6.9 g/dL (ref 6.0–8.3)

## 2023-11-05 LAB — BASIC METABOLIC PANEL
BUN: 17 mg/dL (ref 6–23)
CO2: 34 meq/L — ABNORMAL HIGH (ref 19–32)
Calcium: 8.7 mg/dL (ref 8.4–10.5)
Chloride: 103 meq/L (ref 96–112)
Creatinine, Ser: 0.83 mg/dL (ref 0.40–1.50)
GFR: 98.57 mL/min (ref >60.00)
Glucose, Bld: 105 mg/dL — ABNORMAL HIGH (ref 70–99)
Potassium: 4.4 meq/L (ref 3.5–5.1)
Sodium: 142 meq/L (ref 135–145)

## 2023-11-05 LAB — LIPID PANEL
Cholesterol: 114 mg/dL (ref 0–200)
HDL: 35.8 mg/dL — ABNORMAL LOW (ref >39.00)
LDL Cholesterol: 35 mg/dL (ref 0–99)
NonHDL: 77.89
Total CHOL/HDL Ratio: 3
Triglycerides: 212 mg/dL — ABNORMAL HIGH (ref 0.0–149.0)
VLDL: 42.4 mg/dL — ABNORMAL HIGH (ref 0.0–40.0)

## 2023-11-05 LAB — TSH: TSH: 1.79 u[IU]/mL (ref 0.35–5.50)

## 2023-11-05 LAB — VITAMIN D 25 HYDROXY (VIT D DEFICIENCY, FRACTURES): VITD: 26.02 ng/mL — ABNORMAL LOW (ref 30.00–100.00)

## 2023-11-05 LAB — HEMOGLOBIN A1C: Hgb A1c MFr Bld: 6.8 % — ABNORMAL HIGH (ref 4.6–6.5)

## 2023-11-05 LAB — PSA: PSA: 0.46 ng/mL (ref 0.10–4.00)

## 2023-11-05 MED ORDER — FAMOTIDINE 20 MG PO TABS: 20.0000 mg | ORAL_TABLET | Freq: Two times a day (BID) | ORAL | 3 refills | Status: AC

## 2023-11-05 MED ORDER — ROSUVASTATIN CALCIUM 40 MG PO TABS: 40.0000 mg | ORAL_TABLET | Freq: Every day | ORAL | 3 refills | Status: DC

## 2023-11-05 MED ORDER — AMITRIPTYLINE HCL 10 MG PO TABS: ORAL_TABLET | ORAL | 1 refills | Status: AC

## 2023-11-05 NOTE — Assessment & Plan Note (Signed)
Last vitamin D Lab Results  Component Value Date   VD25OH 27.04 (L) 10/30/2022   Low, to start oral replacement

## 2023-11-05 NOTE — Progress Notes (Signed)
Patient ID: Roger Long, male   DOB: 04/26/1966, 57 y.o.   MRN: [id number]         Chief Complaint:: wellness exam and Annual Exam (Would like a chest xray on his lungs and would like to get a prostate exam , for the past couple of months he gets easily winded during yard work)  , hx colon polyps, low vit d, htn, hld, hyperglycemia       HPI:  Roger Long is a 57 y.o. male here for wellness exam; declines covid booster, due for colonoscopy, o/w up to date                        Also Pt denies chest pain, wheezing, orthopnea, PND, increased LE swelling, palpitations, dizziness or syncope, but does have 2-3 mo worsening unusual sob doe and has 2 co workers in the [employer] that had lung disease, one with a reaction that had him in ICU.   Pt denies polydipsia, polyuria, or new focal neuro s/s.    Pt denies fever, wt loss, night sweats, loss of appetite, or other constitutional symptoms     Wt Readings from Last 3 Encounters:  11/05/23 147 lb (66.7 kg)  10/30/22 143 lb (64.9 kg)  05/08/22 143 lb (64.9 kg)   BP Readings from Last 3 Encounters:  11/05/23 132/80  10/30/22 108/64  05/08/22 100/70   Immunization History  Administered Date(s) Administered   Influenza Split 09/13/2012, 09/29/2021   Influenza,inj,Quad PF,6+ Mos 08/29/2015, 08/01/2017, 09/15/2019, 09/12/2023   Influenza-Unspecified 08/14/2016, 08/14/2018, 09/04/2020, 08/30/2022   PFIZER(Purple Top)SARS-COV-2 Vaccination 02/26/2020, 03/19/2020, 10/27/2020, 04/04/2021   Td 07/19/2009   Tdap 10/06/2019   Zoster Recombinant(Shingrix) 06/28/2023, 08/30/2023   Health Maintenance Due  Topic Date Due   Colonoscopy  07/06/2023   COVID-19 Vaccine (5 - 2023-24 season) 08/15/2023      Past Medical History:  Diagnosis Date   ALLERGIC RHINITIS 07/19/2009   BACK PAIN 07/19/2009   GERD 08/06/2010   H. pylori infection    Headache(784.0) 08/06/2010   HTN (hypertension) 10/06/2019   HYPERLIPIDEMIA 08/06/2010   Hypertension     Impaired glucose tolerance 09/26/2011   PLANTAR FASCIITIS 07/19/2009   Past Surgical History:  Procedure Laterality Date   24 HOUR PH STUDY N/A 03/24/2021   Procedure: 24 HOUR PH STUDY;  Surgeon: Sherrilyn Rist, MD;  Location: [facility];  Service: Gastroenterology;  Laterality: N/A;   ESOPHAGEAL MANOMETRY N/A 03/24/2021   Procedure: ESOPHAGEAL MANOMETRY (EM);  Surgeon: Sherrilyn Rist, MD;  Location: [facility];  Service: Gastroenterology;  Laterality: N/A;   WISDOM TOOTH EXTRACTION      reports that he has quit smoking. He has never used smokeless tobacco. He reports that he does not currently use alcohol. He reports that he does not use drugs. family history includes Hypertension in his mother; Stroke in his mother. No Known Allergies Current Outpatient Medications on File Prior to Visit  Medication Sig Dispense Refill   Cholecalciferol 50 MCG (2000 UT) TABS 1 tab by mouth once daily 30 tablet 99   lisinopril (ZESTRIL) 10 MG tablet TAKE 1 TABLET(10 MG) BY MOUTH DAILY 90 tablet 3   No current facility-administered medications on file prior to visit.        ROS:  All others reviewed and negative.  Objective        PE:  BP 132/80 (BP Location: Right Arm, Patient Position: Sitting, Cuff Size: Normal)   Pulse  80   Temp 98 F (36.7 C) (Oral)   Ht 5\' 2"  (1.575 m)   Wt 147 lb (66.7 kg)   SpO2 98%   BMI 26.89 kg/m                 Constitutional: Pt appears in NAD               HENT: Head: NCAT.                Right Ear: External ear normal.                 Left Ear: External ear normal.                Eyes: . Pupils are equal, round, and reactive to light. Conjunctivae and EOM are normal               Nose: without d/c or deformity               Neck: Neck supple. Gross normal ROM               Cardiovascular: Normal rate and regular rhythm.                 Pulmonary/Chest: Effort normal and breath sounds without rales or wheezing.                Abd:  Soft, NT, ND, + BS,  no organomegaly               Neurological: Pt is alert. At baseline orientation, motor grossly intact               Skin: Skin is warm. No rashes, no other new lesions, LE edema - none               Psychiatric: Pt behavior is normal without agitation   Micro: none  Cardiac tracings I have personally interpreted today:  none  Pertinent Radiological findings (summarize): none   Lab Results  Component Value Date   WBC 8.2 10/30/2022   HGB 16.0 10/30/2022   HCT 49.1 10/30/2022   PLT 359.0 10/30/2022   GLUCOSE 113 (H) 10/30/2022   CHOL 236 (H) 10/30/2022   TRIG 285.0 (H) 10/30/2022   HDL 39.10 10/30/2022   LDLDIRECT 162.0 10/30/2022   LDLCALC 143 (H) 09/30/2018   ALT 22 10/30/2022   AST 19 10/30/2022   NA 139 10/30/2022   K 3.9 10/30/2022   CL 102 10/30/2022   CREATININE 0.76 10/30/2022   BUN 14 10/30/2022   CO2 33 (H) 10/30/2022   TSH 1.49 10/30/2022   PSA 0.65 10/30/2022   HGBA1C 6.5 10/30/2022   MICROALBUR <0.7 10/30/2022   Assessment/Plan:  Roger Long is a 57 y.o. Asian [4] male with  has a past medical history of ALLERGIC RHINITIS (07/19/2009), BACK PAIN (07/19/2009), GERD (08/06/2010), H. pylori infection, Headache(784.0) (08/06/2010), HTN (hypertension) (10/06/2019), HYPERLIPIDEMIA (08/06/2010), Hypertension, Impaired glucose tolerance (09/26/2011), and PLANTAR FASCIITIS (07/19/2009).  Encounter for well adult exam with abnormal findings Age and sex appropriate education and counseling updated with regular exercise and diet Referrals for preventative services - due for colonoscopy Immunizations addressed - declines covid booster,  Smoking counseling  - none needed Evidence for depression or other mood disorder - none significant Most recent labs reviewed. I have personally reviewed and have noted: 1) the patient's medical and social history 2) The patient's current medications and supplements 3) The patient's height, weight, and BMI  have been recorded in the  chart   Vitamin D deficiency Last vitamin D Lab Results  Component Value Date   VD25OH 27.04 (L) 10/30/2022   Low, to start oral replacement   Impaired glucose tolerance Lab Results  Component Value Date   HGBA1C 6.5 10/30/2022   Stable, pt to continue current medical treatment  - diet, wt control   Hyperlipidemia Lab Results  Component Value Date   LDLCALC 143 (H) 09/30/2018   Uncontrolled, now taking crestor 40 mg every day,, pt to continue current statin and check f/I lab, cont lower chol diet   HTN (hypertension) BP Readings from Last 3 Encounters:  11/05/23 132/80  10/30/22 108/64  05/08/22 100/70   Stable, pt to continue medical treatment lisinopril 10 qd   DOE (dyspnea on exertion) Mild to mod, etiology unclear , for labs as ordered, also cxr  Followup: No follow-ups on file.  Oliver Barre, MD 11/05/2023 3:48 PM [facility]

## 2023-11-05 NOTE — Assessment & Plan Note (Signed)
Lab Results  Component Value Date   HGBA1C 6.5 10/30/2022   Stable, pt to continue current medical treatment  - diet, wt control

## 2023-11-05 NOTE — Assessment & Plan Note (Signed)
Mild to mod, etiology unclear , for labs as ordered, also cxr

## 2023-11-05 NOTE — Assessment & Plan Note (Signed)
BP Readings from Last 3 Encounters:  11/05/23 132/80  10/30/22 108/64  05/08/22 100/70   Stable, pt to continue medical treatment lisinopril 10 qd

## 2023-11-05 NOTE — Assessment & Plan Note (Signed)
Age and sex appropriate education and counseling updated with regular exercise and diet Referrals for preventative services - due for colonoscopy Immunizations addressed - declines covid booster,  Smoking counseling  - none needed Evidence for depression or other mood disorder - none significant Most recent labs reviewed. I have personally reviewed and have noted: 1) the patient's medical and social history 2) The patient's current medications and supplements 3) The patient's height, weight, and BMI have been recorded in the chart

## 2023-11-05 NOTE — Assessment & Plan Note (Signed)
Lab Results  Component Value Date   LDLCALC 143 (H) 09/30/2018   Uncontrolled, now taking crestor 40 mg every day,, pt to continue current statin and check f/I lab, cont lower chol diet

## 2023-11-05 NOTE — Progress Notes (Signed)
The test results show that your current treatment is OK, as the tests are stable.  Please continue the same plan.  There is no other need for change of treatment or further evaluation based on these results, at this time.  thanks 

## 2023-11-21 ENCOUNTER — Other Ambulatory Visit: Payer: Self-pay | Admitting: Internal Medicine

## 2023-11-21 DIAGNOSIS — Z77018 Contact with and (suspected) exposure to other hazardous metals: Secondary | ICD-10-CM

## 2023-11-21 DIAGNOSIS — R0609 Other forms of dyspnea: Secondary | ICD-10-CM

## 2024-10-05 ENCOUNTER — Encounter: Payer: Self-pay | Admitting: Internal Medicine

## 2024-10-05 ENCOUNTER — Other Ambulatory Visit: Payer: Self-pay

## 2024-10-05 MED ORDER — LISINOPRIL 10 MG PO TABS: ORAL_TABLET | ORAL | 3 refills | Status: DC

## 2024-11-17 ENCOUNTER — Encounter: Payer: Self-pay | Admitting: Internal Medicine

## 2024-11-17 ENCOUNTER — Ambulatory Visit: Payer: BC Managed Care – PPO | Admitting: Internal Medicine

## 2024-11-17 ENCOUNTER — Ambulatory Visit: Payer: Self-pay | Admitting: Internal Medicine

## 2024-11-17 VITALS — BP 122/76 | HR 72 | Temp 98.2°F | Ht 62.0 in | Wt 152.0 lb

## 2024-11-17 DIAGNOSIS — Z860100 Personal history of colon polyps, unspecified: Secondary | ICD-10-CM | POA: Insufficient documentation

## 2024-11-17 DIAGNOSIS — E7800 Pure hypercholesterolemia, unspecified: Secondary | ICD-10-CM

## 2024-11-17 DIAGNOSIS — Z0001 Encounter for general adult medical examination with abnormal findings: Secondary | ICD-10-CM

## 2024-11-17 DIAGNOSIS — R7302 Impaired glucose tolerance (oral): Secondary | ICD-10-CM

## 2024-11-17 DIAGNOSIS — K0510 Chronic gingivitis, plaque induced: Secondary | ICD-10-CM | POA: Insufficient documentation

## 2024-11-17 DIAGNOSIS — E559 Vitamin D deficiency, unspecified: Secondary | ICD-10-CM

## 2024-11-17 DIAGNOSIS — Z125 Encounter for screening for malignant neoplasm of prostate: Secondary | ICD-10-CM

## 2024-11-17 LAB — URINALYSIS, ROUTINE W REFLEX MICROSCOPIC
Bilirubin Urine: NEGATIVE
Ketones, ur: NEGATIVE
Leukocytes,Ua: NEGATIVE
Nitrite: NEGATIVE
Specific Gravity, Urine: <=1.005 — AB (ref 1.000–1.030)
Total Protein, Urine: NEGATIVE
Urine Glucose: NEGATIVE
Urobilinogen, UA: 0.2 (ref 0.0–1.0)
pH: 6 (ref 5.0–8.0)

## 2024-11-17 LAB — CBC WITH DIFFERENTIAL/PLATELET
Basophils Absolute: 0 K/uL (ref 0.0–0.1)
Basophils Relative: 0.4 % (ref 0.0–3.0)
Eosinophils Absolute: 0.3 K/uL (ref 0.0–0.7)
Eosinophils Relative: 3.1 % (ref 0.0–5.0)
HCT: 45.4 % (ref 39.0–52.0)
Hemoglobin: 15 g/dL (ref 13.0–17.0)
Lymphocytes Relative: 36.3 % (ref 12.0–46.0)
Lymphs Abs: 3.3 K/uL (ref 0.7–4.0)
MCHC: 32.9 g/dL (ref 30.0–36.0)
MCV: 89 fl (ref 78.0–100.0)
Monocytes Absolute: 0.6 K/uL (ref 0.1–1.0)
Monocytes Relative: 7.2 % (ref 3.0–12.0)
Neutro Abs: 4.7 K/uL (ref 1.4–7.7)
Neutrophils Relative %: 53 % (ref 43.0–77.0)
Platelets: 341 K/uL (ref 150.0–400.0)
RBC: 5.11 Mil/uL (ref 4.22–5.81)
RDW: 13.2 % (ref 11.5–15.5)
WBC: 9 K/uL (ref 4.0–10.5)

## 2024-11-17 LAB — BASIC METABOLIC PANEL WITH GFR
BUN: 12 mg/dL (ref 6–23)
CO2: 31 meq/L (ref 19–32)
Calcium: 9.3 mg/dL (ref 8.4–10.5)
Chloride: 102 meq/L (ref 96–112)
Creatinine, Ser: 0.7 mg/dL (ref 0.40–1.50)
GFR: 103.02 mL/min (ref >60.00)
Glucose, Bld: 98 mg/dL (ref 70–99)
Potassium: 4.2 meq/L (ref 3.5–5.1)
Sodium: 141 meq/L (ref 135–145)

## 2024-11-17 LAB — LIPID PANEL
Cholesterol: 101 mg/dL (ref 0–200)
HDL: 39.2 mg/dL (ref >39.00)
LDL Cholesterol: 39 mg/dL (ref 0–99)
NonHDL: 62.26
Total CHOL/HDL Ratio: 3
Triglycerides: 116 mg/dL (ref 0.0–149.0)
VLDL: 23.2 mg/dL (ref 0.0–40.0)

## 2024-11-17 LAB — HEPATIC FUNCTION PANEL
ALT: 31 U/L (ref 0–53)
AST: 21 U/L (ref 0–37)
Albumin: 4.8 g/dL (ref 3.5–5.2)
Alkaline Phosphatase: 68 U/L (ref 39–117)
Bilirubin, Direct: 0.1 mg/dL (ref 0.0–0.3)
Total Bilirubin: 0.4 mg/dL (ref 0.2–1.2)
Total Protein: 7.4 g/dL (ref 6.0–8.3)

## 2024-11-17 LAB — HEMOGLOBIN A1C: Hgb A1c MFr Bld: 6.6 % — ABNORMAL HIGH (ref 4.6–6.5)

## 2024-11-17 LAB — PSA: PSA: 0.45 ng/mL (ref 0.10–4.00)

## 2024-11-17 LAB — VITAMIN D 25 HYDROXY (VIT D DEFICIENCY, FRACTURES): VITD: 25.45 ng/mL — ABNORMAL LOW (ref 30.00–100.00)

## 2024-11-17 LAB — TSH: TSH: 2.59 u[IU]/mL (ref 0.35–5.50)

## 2024-11-17 MED ORDER — AMOXICILLIN-POT CLAVULANATE 875-125 MG PO TABS: 1.0000 | ORAL_TABLET | Freq: Two times a day (BID) | ORAL | 0 refills | Status: AC

## 2024-11-17 NOTE — Assessment & Plan Note (Signed)
 Lab Results  Component Value Date   LDLCALC 39 11/17/2024   Stable, pt to continue current statin crestor  40 mg qd

## 2024-11-17 NOTE — Assessment & Plan Note (Signed)
 With significant right lower jaw infection and facial swelling - for augmentin  875 bid course

## 2024-11-17 NOTE — Assessment & Plan Note (Signed)
 Last vitamin D  Lab Results  Component Value Date   VD25OH 25.45 (L) 11/17/2024   Low, to start oral replacement

## 2024-11-17 NOTE — Assessment & Plan Note (Signed)
 Age and sex appropriate education and counseling updated with regular exercise and diet Referrals for preventative services - for colonoscopy Immunizations addressed - declines hep B and prevnar for now Smoking counseling  - none needed Evidence for depression or other mood disorder - none significant Most recent labs reviewed. I have personally reviewed and have noted: 1) the patient's medical and social history 2) The patient's current medications and supplements 3) The patient's height, weight, and BMI have been recorded in the chart

## 2024-11-17 NOTE — Assessment & Plan Note (Signed)
For colonoscopy as is due 

## 2024-11-17 NOTE — Progress Notes (Signed)
 Patient ID: Roger Long, male   DOB: 1966-10-26, 58 y.o.   MRN: [id number]         Chief Complaint:: wellness exam and right dental infection gingivitis, hx of colon polyps, low vit d, hyperglycemia, hld       HPI:  [name] is a 58 y.o. male here for wellness exam; declines all vaccinations, due for colonoscopy, o/w up to date                        Also Pt denies chest pain, increased sob or doe, wheezing, orthopnea, PND, increased LE swelling, palpitations, dizziness or syncope.   Pt denies polydipsia, polyuria, or new focal neuro s/s.    Pt denies fever, wt loss, night sweats, loss of appetite, or other constitutional symptoms  Also with incidental 3-4 days onset right lower jaw pain and swelling without abscess or drainage, and maybe with low grade temp.    Wt Readings from Last 3 Encounters:  11/17/24 152 lb (68.9 kg)  11/05/23 147 lb (66.7 kg)  10/30/22 143 lb (64.9 kg)   BP Readings from Last 3 Encounters:  11/17/24 122/76  11/05/23 132/80  10/30/22 108/64   Immunization History  Administered Date(s) Administered   Influenza Split 09/13/2012, 09/29/2021   Influenza, Seasonal, Injecte, Preservative Fre 08/23/2016   Influenza,inj,Quad PF,6+ Mos 08/29/2015, 08/01/2017, 09/15/2019, 09/12/2023   Influenza-Unspecified 08/14/2016, 08/14/2018, 09/04/2020, 08/30/2022, 09/04/2024   PFIZER(Purple Top)SARS-COV-2 Vaccination 02/26/2020, 03/19/2020, 10/27/2020, 04/04/2021   Td 07/19/2009   Tdap 10/06/2019   Zoster Recombinant(Shingrix) 06/28/2023, 08/30/2023   Health Maintenance Due  Topic Date Due   Hepatitis B Vaccines 19-59 Average Risk (1 of 3 - 19+ 3-dose series) Never done   Pneumococcal Vaccine: 50+ Years (1 of 1 - PCV) Never done   Colonoscopy  07/06/2023      Past Medical History:  Diagnosis Date   ALLERGIC RHINITIS 07/19/2009   BACK PAIN 07/19/2009   GERD 08/06/2010   H. pylori infection    Headache(784.0) 08/06/2010   HTN (hypertension) 10/06/2019   HYPERLIPIDEMIA  08/06/2010   Hypertension    Impaired glucose tolerance 09/26/2011   PLANTAR FASCIITIS 07/19/2009   Past Surgical History:  Procedure Laterality Date   24 HOUR PH STUDY N/A 03/24/2021   Procedure: 24 HOUR PH STUDY;  Surgeon: Legrand Victory LITTIE DOUGLAS, MD;  Location: [facility];  Service: Gastroenterology;  Laterality: N/A;   ESOPHAGEAL MANOMETRY N/A 03/24/2021   Procedure: ESOPHAGEAL MANOMETRY (EM);  Surgeon: Legrand Victory LITTIE DOUGLAS, MD;  Location: [facility];  Service: Gastroenterology;  Laterality: N/A;   WISDOM TOOTH EXTRACTION      reports that he has quit smoking. He has never used smokeless tobacco. He reports that he does not currently use alcohol . He reports that he does not use drugs. family history includes Hypertension in his mother; Stroke in his mother. No Known Allergies Current Outpatient Medications on File Prior to Visit  Medication Sig Dispense Refill   amitriptyline  (ELAVIL ) 10 MG tablet TAKE 2 TABLETS(20 MG) BY MOUTH AT BEDTIME 180 tablet 1   chlorhexidine  (PERIDEX ) 0.12 % solution RINSE MOUTH WITH FOR 30 SECONDS THEN SPIT OUT IN THE AM MORNING AND EVENING     Cholecalciferol  50 MCG (2000 UT) TABS 1 tab by mouth once daily 30 tablet 99   famotidine  (PEPCID ) 20 MG tablet Take 1 tablet (20 mg total) by mouth 2 (two) times daily. 180 tablet 3   ibuprofen  (ADVIL ) 600 MG tablet Take 600  mg by mouth every 6 (six) hours as needed.     lisinopril  (ZESTRIL ) 10 MG tablet TAKE 1 TABLET(10 MG) BY MOUTH DAILY 90 tablet 3   rosuvastatin  (CRESTOR ) 40 MG tablet Take 1 tablet (40 mg total) by mouth daily. 90 tablet 3   No current facility-administered medications on file prior to visit.        ROS:  All others reviewed and negative.  Objective        PE:  BP 122/76 (BP Location: Right Arm, Patient Position: Sitting, Cuff Size: Normal)   Pulse 72   Temp 98.2 F (36.8 C) (Oral)   Ht 5' 2 (1.575 m)   Wt 152 lb (68.9 kg)   SpO2 98%   BMI 27.80 kg/m                 Constitutional: Pt  appears in NAD               HENT: Head: NCAT.                Right Ear: External ear normal.                 Left Ear: External ear normal.                Eyes: . Pupils are equal, round, and reactive to light. Conjunctivae and EOM are normal; right face with 3-4 cm area nondiscrete tender swelling without fluctuance or abscess, primairly at right lower lateral jaw dental line               Nose: without d/c or deformity               Neck: Neck supple. Gross normal ROM               Cardiovascular: Normal rate and regular rhythm.                 Pulmonary/Chest: Effort normal and breath sounds without rales or wheezing.                Abd:  Soft, NT, ND, + BS, no organomegaly               Neurological: Pt is alert. At baseline orientation, motor grossly intact               Skin: Skin is warm. No rashes, no other new lesions, LE edema - none               Psychiatric: Pt behavior is normal without agitation   Micro: none  Cardiac tracings I have personally interpreted today:  none  Pertinent Radiological findings (summarize): none   Lab Results  Component Value Date   WBC 9.0 11/17/2024   HGB 15.0 11/17/2024   HCT 45.4 11/17/2024   PLT 341.0 11/17/2024   GLUCOSE 98 11/17/2024   CHOL 101 11/17/2024   TRIG 116.0 11/17/2024   HDL 39.20 11/17/2024   LDLDIRECT 162.0 10/30/2022   LDLCALC 39 11/17/2024   ALT 31 11/17/2024   AST 21 11/17/2024   NA 141 11/17/2024   K 4.2 11/17/2024   CL 102 11/17/2024   CREATININE 0.70 11/17/2024   BUN 12 11/17/2024   CO2 31 11/17/2024   TSH 2.59 11/17/2024   PSA 0.45 11/17/2024   HGBA1C 6.6 (H) 11/17/2024   Assessment/Plan:  Roger Long is a 58 y.o. Asian [4] male with  has a past medical history of ALLERGIC RHINITIS (  07/19/2009), BACK PAIN (07/19/2009), GERD (08/06/2010), H. pylori infection, Headache(784.0) (08/06/2010), HTN (hypertension) (10/06/2019), HYPERLIPIDEMIA (08/06/2010), Hypertension, Impaired glucose tolerance (09/26/2011), and PLANTAR  FASCIITIS (07/19/2009).  Encounter for well adult exam with abnormal findings Age and sex appropriate education and counseling updated with regular exercise and diet Referrals for preventative services - for colonoscopy Immunizations addressed - declines hep B and prevnar for now Smoking counseling  - none needed Evidence for depression or other mood disorder - none significant Most recent labs reviewed. I have personally reviewed and have noted: 1) the patient's medical and social history 2) The patient's current medications and supplements 3) The patient's height, weight, and BMI have been recorded in the chart   Vitamin D  deficiency Last vitamin D  Lab Results  Component Value Date   VD25OH 25.45 (L) 11/17/2024   Low, to start oral replacement   Impaired glucose tolerance Lab Results  Component Value Date   HGBA1C 6.6 (H) 11/17/2024   Stable, pt to continue current medical treatment  - diet, wt control   Hyperlipidemia Lab Results  Component Value Date   LDLCALC 39 11/17/2024   Stable, pt to continue current statin crestor  40 mg qd   Gingivitis With significant right lower jaw infection and facial swelling - for augmentin  875 bid course  History of colonic polyps For colonoscopy as is due  Followup: Return in about 1 year (around 11/17/2025).  Lynwood Rush, MD 11/17/2024 8:54 PM [facility]

## 2024-11-17 NOTE — Patient Instructions (Signed)
 Please take all new medication as prescribed - the antibiotic  Please continue all other medications as before, and refills have been done if requested.  Please have the pharmacy call with any other refills you may need.  Please continue your efforts at being more active, low cholesterol diet, and weight control.  Please keep your appointments with your specialists as you may have planned  You will be contacted regarding the referral for: Colonoscopy with Dr Legrand  Please go to the LAB at the blood drawing area for the tests to be done  You will be contacted by phone if any changes need to be made immediately.  Otherwise, you will receive a letter about your results with an explanation, but please check with MyChart first.  Please make an Appointment to return for your 1 year visit, or sooner if needed

## 2024-11-17 NOTE — Assessment & Plan Note (Signed)
 Lab Results  Component Value Date   HGBA1C 6.6 (H) 11/17/2024   Stable, pt to continue current medical treatment  - diet, wt control

## 2024-11-20 ENCOUNTER — Other Ambulatory Visit: Payer: Self-pay

## 2024-11-20 MED ORDER — ROSUVASTATIN CALCIUM 40 MG PO TABS: 40.0000 mg | ORAL_TABLET | Freq: Every day | ORAL | 3 refills | Status: AC

## 2024-11-20 MED ORDER — LISINOPRIL 10 MG PO TABS: ORAL_TABLET | ORAL | 3 refills | Status: AC
